# Patient Record
Sex: Male | Born: 1945 | Race: White | Hispanic: No | Marital: Married | Smoking: Former smoker
Health system: Southern US, Community
[De-identification: ages and names within clinical notes are randomized; demographics above are authoritative.]

## PROBLEM LIST (undated history)

## (undated) DIAGNOSIS — E785 Hyperlipidemia, unspecified: Secondary | ICD-10-CM

## (undated) DIAGNOSIS — S060X9A Concussion with loss of consciousness of unspecified duration, initial encounter: Secondary | ICD-10-CM

## (undated) DIAGNOSIS — D649 Anemia, unspecified: Secondary | ICD-10-CM

## (undated) DIAGNOSIS — N529 Male erectile dysfunction, unspecified: Secondary | ICD-10-CM

## (undated) DIAGNOSIS — N2581 Secondary hyperparathyroidism of renal origin: Secondary | ICD-10-CM

## (undated) DIAGNOSIS — Z992 Dependence on renal dialysis: Secondary | ICD-10-CM

## (undated) DIAGNOSIS — S060XAA Concussion with loss of consciousness status unknown, initial encounter: Secondary | ICD-10-CM

## (undated) DIAGNOSIS — R809 Proteinuria, unspecified: Secondary | ICD-10-CM

## (undated) DIAGNOSIS — E875 Hyperkalemia: Secondary | ICD-10-CM

## (undated) DIAGNOSIS — N2589 Other disorders resulting from impaired renal tubular function: Secondary | ICD-10-CM

## (undated) DIAGNOSIS — I10 Essential (primary) hypertension: Secondary | ICD-10-CM

## (undated) DIAGNOSIS — N184 Chronic kidney disease, stage 4 (severe): Secondary | ICD-10-CM

## (undated) HISTORY — DX: Chronic kidney disease, stage 4 (severe): N18.4

## (undated) HISTORY — DX: Hyperkalemia: E87.5

## (undated) HISTORY — DX: Secondary hyperparathyroidism of renal origin: N25.81

## (undated) HISTORY — DX: Anemia, unspecified: D64.9

## (undated) HISTORY — DX: Essential (primary) hypertension: I10

## (undated) HISTORY — DX: Male erectile dysfunction, unspecified: N52.9

## (undated) HISTORY — DX: Proteinuria, unspecified: R80.9

## (undated) HISTORY — DX: Hyperlipidemia, unspecified: E78.5

## (undated) HISTORY — DX: Other disorders resulting from impaired renal tubular function: N25.89

## (undated) HISTORY — PX: TONSILLECTOMY: SUR1361

---

## 2015-12-17 ENCOUNTER — Other Ambulatory Visit: Payer: Self-pay | Admitting: *Deleted

## 2015-12-17 DIAGNOSIS — N185 Chronic kidney disease, stage 5: Secondary | ICD-10-CM

## 2015-12-17 DIAGNOSIS — Z01810 Encounter for preprocedural cardiovascular examination: Secondary | ICD-10-CM

## 2015-12-18 ENCOUNTER — Encounter: Payer: Self-pay | Admitting: *Deleted

## 2016-01-06 ENCOUNTER — Encounter: Payer: Self-pay | Admitting: Vascular Surgery

## 2016-01-13 ENCOUNTER — Ambulatory Visit (INDEPENDENT_AMBULATORY_CARE_PROVIDER_SITE_OTHER)
Admission: RE | Admit: 2016-01-13 | Discharge: 2016-01-13 | Disposition: A | Discharge status: Home / Self Care | Payer: Medicare Other | Source: Ambulatory Visit | Attending: Vascular Surgery | Admitting: Vascular Surgery

## 2016-01-13 ENCOUNTER — Encounter: Payer: Self-pay | Admitting: Vascular Surgery

## 2016-01-13 ENCOUNTER — Ambulatory Visit (INDEPENDENT_AMBULATORY_CARE_PROVIDER_SITE_OTHER): Payer: Medicare Other | Admitting: Vascular Surgery

## 2016-01-13 ENCOUNTER — Ambulatory Visit (HOSPITAL_COMMUNITY)
Admission: RE | Admit: 2016-01-13 | Discharge: 2016-01-13 | Disposition: A | Discharge status: Home / Self Care | Payer: Medicare Other | Source: Ambulatory Visit | Attending: Vascular Surgery | Admitting: Vascular Surgery

## 2016-01-13 VITALS — BP 156/99 | HR 67 | Ht 66.0 in | Wt 162.3 lb

## 2016-01-13 DIAGNOSIS — N184 Chronic kidney disease, stage 4 (severe): Secondary | ICD-10-CM | POA: Diagnosis not present

## 2016-01-13 DIAGNOSIS — Z01810 Encounter for preprocedural cardiovascular examination: Secondary | ICD-10-CM

## 2016-01-13 DIAGNOSIS — E785 Hyperlipidemia, unspecified: Secondary | ICD-10-CM | POA: Insufficient documentation

## 2016-01-13 DIAGNOSIS — N185 Chronic kidney disease, stage 5: Secondary | ICD-10-CM | POA: Diagnosis not present

## 2016-01-13 DIAGNOSIS — N2581 Secondary hyperparathyroidism of renal origin: Secondary | ICD-10-CM | POA: Insufficient documentation

## 2016-01-13 DIAGNOSIS — K219 Gastro-esophageal reflux disease without esophagitis: Secondary | ICD-10-CM | POA: Insufficient documentation

## 2016-01-13 DIAGNOSIS — I129 Hypertensive chronic kidney disease with stage 1 through stage 4 chronic kidney disease, or unspecified chronic kidney disease: Secondary | ICD-10-CM | POA: Insufficient documentation

## 2016-01-13 NOTE — Progress Notes (Signed)
HISTORY AND PHYSICAL   History of Present Illness:  Patient is a 70 y.o. year old male who presents for placement of a permanent hemodialysis access. The patient is right handed.  He states he has about 15% kidney function at this time.  He is other wise very health with HTN managed with amlodipine, hypercholesterolemia managed with Lipitor.  He has no history of CAD or DM.  Past Medical History  Diagnosis Date  . Erectile dysfunction   . Esophageal reflux   . Chronic kidney disease, stage IV (severe) ([place])   . Hyperkalemia   . Secondary hyperparathyroidism of renal origin ([place])   . Proteinuria   . Hypertension   . Hyperlipidemia   . Renal tubular acidosis, type 1   . Anemia     Past Surgical History  Procedure Laterality Date  . Tonsillectomy       Social History Social History  Substance Use Topics  . Smoking status: Former Smoker    Quit date: 07/18/1989  . Smokeless tobacco: None  . Alcohol Use: 4.2 oz/week    7 Standard drinks or equivalent per week    Family History Family History  Problem Relation Age of Onset  . Hypertension Father   . Diabetes Father     Allergies  No Known Allergies   Current Outpatient Prescriptions  Medication Sig Dispense Refill  . aspirin 81 MG tablet Take 81 mg by mouth daily. Reported on 01/13/2016    . atorvastatin (LIPITOR) 20 MG tablet Take 20 mg by mouth daily. Reported on 01/13/2016    . calcitRIOL (ROCALTROL) 0.25 MCG capsule Take 0.25 mcg by mouth daily. Reported on 01/13/2016    . lisinopril (PRINIVIL,ZESTRIL) 10 MG tablet Take 10 mg by mouth daily. Reported on 01/13/2016    . sodium bicarbonate 650 MG tablet Take 650 mg by mouth 2 (two) times daily. Reported on 01/13/2016    . tadalafil (CIALIS) 20 MG tablet Take 10 mg by mouth daily as needed for erectile dysfunction. Reported on 01/13/2016     No current facility-administered medications for this visit.    ROS:   General:  No weight loss, Fever, chills  HEENT:  No recent headaches, no nasal bleeding, no visual changes, no sore throat  Neurologic: No dizziness, blackouts, seizures. No recent symptoms of stroke or mini- stroke. No recent episodes of slurred speech, or temporary blindness.  Cardiac: No recent episodes of chest pain/pressure, no shortness of breath at rest.  No shortness of breath with exertion.  Denies history of atrial fibrillation or irregular heartbeat  Vascular: No history of rest pain in feet.  No history of claudication.  No history of non-healing ulcer, No history of DVT   Pulmonary: No home oxygen, no productive cough, no hemoptysis,  No asthma or wheezing  Musculoskeletal:  [x ] Arthritis, [ ]  Low back pain,  [ ]  Joint pain  Hematologic:No history of hypercoagulable state.  No history of easy bleeding.  No history of anemia  Gastrointestinal: No hematochezia or melena,  No gastroesophageal reflux, no trouble swallowing  Urinary: [ x] chronic Kidney disease, [ ]  on HD - [ ]  MWF or [ ]  TTHS, [ ]  Burning with urination, [ ]  Frequent urination, [ ]  Difficulty urinating;   Skin: No rashes  Psychological: No history of anxiety,  No history of depression   Physical Examination  Filed Vitals:   01/13/16 1434 01/13/16 1437  BP: 160/100 156/99  Pulse: 67   Height: 5\' 6"  (  1.676 m)   Weight: 162 lb 4.8 oz (73.619 kg)   SpO2: 100%     Body mass index is 26.21 kg/(m^2).  General:  Alert and oriented, no acute distress HEENT: Normal Neck: No bruit or JVD Pulmonary: Clear to auscultation bilaterally Cardiac: Regular Rate and Rhythm without murmur Gastrointestinal: Soft, non-tender, non-distended, no mass, no scars Skin: No rash Extremity Pulses:  2+ radial, brachial pulses bilaterally Musculoskeletal: No deformity or edema  Neurologic: Upper and lower extremity motor 5/5 and symmetric  DATA:  Vein mapping shows marginal veins with the right upper arm cephalic being the best option 0.22-0.28 cm in  diameter.   ASSESSMENT:  CKD stage IV not yet requiring HD   PLAN: He was given information regarding both a fistula creation and av graft by Dr. Scot Dock today.  He states he is also interested in peritoneal HD which he wishes to discuss with Dr. Jimmy Footman as an option on his next visit.  At this time if we proceeded with av fistula creation the best option would be a a right cephalic av fistula due to small vein sizes else where.  He will f/u PRN if he wishes to proceed.    Theda Sers, Leandro Berkowitz MAUREEN PA-C [facility]  The patient was seen in conjunction with Dr. Scot Dock today  I have interviewed the patient and examined the patient. I agree with the findings by the PA.  It looks like his best option for a fistula would be a right brachiocephalic AV fistula. If this vein were not adequate he could potentially be a candidate for a basilic vein transposition either done in 1 stage or 2 stages. If he were not a candidate for either, given that his GFR is less than 15 I could place an AV graft if recommended by Dr. Jimmy Footman.    I have explained the indications for placement of an AV fistula or AV graft. I've explained that if at all possible we will place an AV fistula.  I have reviewed the risks of placement of an AV fistula including but not limited to: failure of the fistula to mature, need for subsequent interventions, and thrombosis. In addition I have reviewed the potential complications of placement of an AV graft. These risks include, but are not limited to, graft thrombosis, graft infection, wound healing problems, bleeding, arm swelling, and steal syndrome. All the patient's questions were answered.  He would like to discuss this further with Dr. Jimmy Footman, before considering surgery.  Gae Gallop, MD [phone]

## 2016-02-16 ENCOUNTER — Other Ambulatory Visit: Payer: Self-pay

## 2016-02-25 ENCOUNTER — Encounter: Payer: Self-pay | Admitting: Vascular Surgery

## 2016-03-01 ENCOUNTER — Ambulatory Visit (INDEPENDENT_AMBULATORY_CARE_PROVIDER_SITE_OTHER): Payer: Medicare Other | Admitting: Vascular Surgery

## 2016-03-01 ENCOUNTER — Encounter: Payer: Self-pay | Admitting: Vascular Surgery

## 2016-03-01 VITALS — BP 159/100 | HR 78 | Temp 98.1°F | Resp 18 | Ht 66.0 in | Wt 160.8 lb

## 2016-03-01 DIAGNOSIS — N184 Chronic kidney disease, stage 4 (severe): Secondary | ICD-10-CM | POA: Diagnosis not present

## 2016-03-01 NOTE — Progress Notes (Signed)
[facility]  Patient name: Rodney Bridges MRN: [id number] DOB: [birth year] Sex: male  REASON FOR VISIT: permanent dialysis access  HPI: Rodney Bridges is a 70 y.o. male, who who presents for evaluation for permanent dialysis access. He was last seen in the office on 01/13/2016 regarding access. At that time, he was not ready to proceed with surgery and wondered to discuss things with Dr. Jimmy Footman. He was also interested in peritoneal dialysis at that time. Today, he says that he is ready to pursue permanent dialysis access. He is projected to be starting dialysis in the next 6-12 months. The patient is right-handed. He reports having proteinuria since he was young and this may have contributed to his kidney disease.   He has no prior history of CAD, CVA or CHF. He is not diabetic.  His hypertension is managed on amlodipine. His hypercholesterolemia is managed on a statin. He reports some left toe pain that has been treated with prednisone. He states that this does not help. He denies any claudication or rest pain issues. He has been complaining of bilateral leg swelling. He denies any significant pain associated with this. He is a former smoker.  Past Medical History:  Diagnosis Date  . Anemia   . Chronic kidney disease, stage IV (severe) ([place])   . Erectile dysfunction   . Esophageal reflux   . Hyperkalemia   . Hyperlipidemia   . Hypertension   . Proteinuria   . Renal tubular acidosis, type 1   . Secondary hyperparathyroidism of renal origin [facility])     Family History  Problem Relation Age of Onset  . Hypertension Father   . Diabetes Father     SOCIAL HISTORY: Social History   Social History  . Marital status: Married    Spouse name: N/A  . Number of children: N/A  . Years of education: N/A   Occupational History  . Not on file.   Social History Main Topics  . Smoking status: Former Smoker    Quit date: 07/18/1989  . Smokeless tobacco:  Never Used  . Alcohol use 4.2 oz/week    7 Standard drinks or equivalent per week  . Drug use: No  . Sexual activity: Not on file   Other Topics Concern  . Not on file   Social History Narrative  . No narrative on file    Allergies  Allergen Reactions  . No Known Allergies   . Prednisone     Difficulty swallowing/reported by patient    Current Outpatient Prescriptions  Medication Sig Dispense Refill  . amLODipine (NORVASC) 10 MG tablet Take 1 tablet by mouth daily.  1  . atorvastatin (LIPITOR) 20 MG tablet Take 20 mg by mouth daily. Reported on 01/13/2016    . calcitRIOL (ROCALTROL) 0.25 MCG capsule Take 0.5 mcg by mouth daily. Reported on 01/13/2016    . furosemide (LASIX) 40 MG tablet Take 80 mg by mouth 2 (two) times daily.  0  . sodium bicarbonate 650 MG tablet Take 1,950 mg by mouth 2 (two) times daily. Reported on 01/13/2016    . tadalafil (CIALIS) 20 MG tablet Take 10 mg by mouth daily as needed for erectile dysfunction. Reported on 01/13/2016    . aspirin 81 MG tablet Take 81 mg by mouth daily. Reported on 01/13/2016     No current facility-administered medications for this visit.     REVIEW OF SYSTEMS:  [X]  denotes positive finding, [ ]  denotes negative finding Cardiac  Comments:  Chest pain or chest pressure:    Shortness of breath upon exertion:    Short of breath when lying flat:    Irregular heart rhythm:        Vascular    Pain in calf, thigh, or hip brought on by ambulation:    Pain in feet at night that wakes you up from your sleep:  x Pain in toe.   Blood clot in your veins:    Leg swelling:  x       Pulmonary    Oxygen at home:    Productive cough:     Wheezing:         Neurologic    Sudden weakness in arms or legs:     Sudden numbness in arms or legs:     Sudden onset of difficulty speaking or slurred speech:    Temporary loss of vision in one eye:     Problems with dizziness:         Gastrointestinal    Blood in stool:     Vomited blood:           Genitourinary    Burning when urinating:     Blood in urine:        Psychiatric    Major depression:         Hematologic    Bleeding problems:    Problems with blood clotting too easily:        Skin    Rashes or ulcers:        Constitutional    Fever or chills:      PHYSICAL EXAM: Vitals:   03/01/16 1541 03/01/16 1544  BP: (!) 158/102 (!) 159/100  Pulse: 78   Resp: 18   Temp: 98.1 F (36.7 C)   TempSrc: Oral   SpO2: 97%   Weight: 160 lb 12.8 oz (72.9 kg)   Height: 5\' 6"  (1.676 m)     GENERAL: The patient is a well-nourished male, in no acute distress. The vital signs are documented above. CARDIAC: There is a regular rate and rhythm. No carotid bruits.  VASCULAR: 2+ radial pulses and brachial pulses bilaterally. 2+ left dorsalis pedis pulse. Prominent left cephalic vein.  PULMONARY: There is good air exchange bilaterally without wheezing or rales.  MUSCULOSKELETAL: There are no major deformities or cyanosis. NEUROLOGIC: No focal weakness or paresthesias are detected. SKIN:Left great toe without any erythema or open wounds. Ischemic changes.  PSYCHIATRIC: The patient has a normal affect.  DATA:  Upper extremity vein mapping 01/13/2016  Upper extremity vein mapping shows marginal right cephalic and basilic veins. Left cephalic is small. Left basilic is marginal.  MEDICAL ISSUES: CKD stage IV  The patient is not yet on dialysis. He is right-handed. His vein mapping shows marginal size veins. However on physical exam and with SonoSite, it appears that he has a adequate left cephalic vein at the wrist. Plan for left radial cephalic fistula tomorrow with Dr. Bridgett Larsson. The procedure, risks and benefits were discussed at length with the patient. He is willing to proceed.   Virgina Jock, PA-C [facility] [place] [phone]   Reimaged his cephalic vein with SonoSite. Dystocia nice caliber vein. Discussed this with patient explaining that  they're occasionally discrepancies between a formal duplex and below what we see with SonoSite. Perhaps he was dry at the time of the duplex. I certainly would recommend proceeding with left radiocephalic fistula his initial fistula attempt. He understands.  I have examined the patient, reviewed and agree with above.Curt Jews, MD 03/01/2016 4:53 PM

## 2016-03-01 NOTE — Progress Notes (Signed)
Vitals:   03/01/16 1541 03/01/16 1544  BP: (!) 158/102 (!) 159/100  Pulse: 78   Resp: 18   Temp: 98.1 F (36.7 C)   TempSrc: Oral   SpO2: 97%   Weight: 160 lb 12.8 oz (72.9 kg)   Height: 5\' 6"  (1.676 m)

## 2016-03-08 ENCOUNTER — Encounter (HOSPITAL_COMMUNITY): Payer: Self-pay | Admitting: *Deleted

## 2016-03-08 NOTE — Anesthesia Preprocedure Evaluation (Addendum)
Anesthesia Evaluation  Patient identified by MRN, date of birth, ID band Patient awake    Reviewed: Allergy & Precautions, NPO status , Patient's Chart, lab work & pertinent test results  History of Anesthesia Complications Negative for: history of anesthetic complications  Airway Mallampati: II  TM Distance: >3 FB Neck ROM: Full    Dental  (+) Dental Advisory Given, Chipped   Pulmonary former smoker (quit 1991),    breath sounds clear to auscultation       Cardiovascular hypertension, Pt. on medications (-) angina Rhythm:Regular Rate:Normal     Neuro/Psych negative neurological ROS     GI/Hepatic negative GI ROS,   Endo/Other  negative endocrine ROS  Renal/GU Renal disease (K+ 3.7)     Musculoskeletal   Abdominal   Peds  Hematology  (+) Blood dyscrasia (Hb 10.9), ,   Anesthesia Other Findings   Reproductive/Obstetrics                           Anesthesia Physical Anesthesia Plan  ASA: III  Anesthesia Plan: MAC   Post-op Pain Management:    Induction: Intravenous  Airway Management Planned: Natural Airway and Simple Face Mask  Additional Equipment:   Intra-op Plan:   Post-operative Plan:   Informed Consent: I have reviewed the patients History and Physical, chart, labs and discussed the procedure including the risks, benefits and alternatives for the proposed anesthesia with the patient or authorized representative who has indicated his/her understanding and acceptance.   Dental advisory given  Plan Discussed with: Surgeon and CRNA  Anesthesia Plan Comments: (Plan routine monitors, MAC)        Anesthesia Quick Evaluation

## 2016-03-09 ENCOUNTER — Ambulatory Visit (HOSPITAL_COMMUNITY)
Admission: RE | Admit: 2016-03-09 | Discharge: 2016-03-09 | Disposition: A | Discharge status: Home / Self Care | Payer: Medicare Other | Source: Ambulatory Visit | Attending: Vascular Surgery | Admitting: Vascular Surgery

## 2016-03-09 ENCOUNTER — Ambulatory Visit (HOSPITAL_COMMUNITY): Payer: Medicare Other | Admitting: Anesthesiology

## 2016-03-09 ENCOUNTER — Encounter (HOSPITAL_COMMUNITY)
Admission: RE | Disposition: A | Discharge status: Home / Self Care | Payer: Self-pay | Source: Ambulatory Visit | Attending: Vascular Surgery

## 2016-03-09 ENCOUNTER — Encounter (HOSPITAL_COMMUNITY): Payer: Self-pay | Admitting: Anesthesiology

## 2016-03-09 DIAGNOSIS — K219 Gastro-esophageal reflux disease without esophagitis: Secondary | ICD-10-CM | POA: Insufficient documentation

## 2016-03-09 DIAGNOSIS — I129 Hypertensive chronic kidney disease with stage 1 through stage 4 chronic kidney disease, or unspecified chronic kidney disease: Secondary | ICD-10-CM | POA: Insufficient documentation

## 2016-03-09 DIAGNOSIS — E785 Hyperlipidemia, unspecified: Secondary | ICD-10-CM | POA: Diagnosis not present

## 2016-03-09 DIAGNOSIS — N529 Male erectile dysfunction, unspecified: Secondary | ICD-10-CM | POA: Insufficient documentation

## 2016-03-09 DIAGNOSIS — N185 Chronic kidney disease, stage 5: Secondary | ICD-10-CM | POA: Diagnosis not present

## 2016-03-09 DIAGNOSIS — Z79899 Other long term (current) drug therapy: Secondary | ICD-10-CM | POA: Diagnosis not present

## 2016-03-09 DIAGNOSIS — N184 Chronic kidney disease, stage 4 (severe): Secondary | ICD-10-CM | POA: Insufficient documentation

## 2016-03-09 DIAGNOSIS — Z87891 Personal history of nicotine dependence: Secondary | ICD-10-CM | POA: Diagnosis not present

## 2016-03-09 DIAGNOSIS — Z7982 Long term (current) use of aspirin: Secondary | ICD-10-CM | POA: Insufficient documentation

## 2016-03-09 HISTORY — DX: Concussion with loss of consciousness status unknown, initial encounter: S06.0XAA

## 2016-03-09 HISTORY — PX: AV FISTULA PLACEMENT: SHX1204

## 2016-03-09 HISTORY — DX: Concussion with loss of consciousness of unspecified duration, initial encounter: S06.0X9A

## 2016-03-09 LAB — POCT I-STAT 4, (NA,K, GLUC, HGB,HCT)
Glucose, Bld: 116 mg/dL — ABNORMAL HIGH (ref 65–99)
HEMATOCRIT: 32 % — AB (ref 39.0–52.0)
Hemoglobin: 10.9 g/dL — ABNORMAL LOW (ref 13.0–17.0)
Potassium: 3.7 mmol/L (ref 3.5–5.1)
Sodium: 143 mmol/L (ref 135–145)

## 2016-03-09 SURGERY — ARTERIOVENOUS (AV) FISTULA CREATION
Anesthesia: Monitor Anesthesia Care | Site: Arm Upper | Laterality: Left

## 2016-03-09 MED ORDER — SODIUM CHLORIDE 0.9 % IV SOLN
INTRAVENOUS | Status: DC | PRN
  Administered 2016-03-09 (×2): via INTRAVENOUS

## 2016-03-09 MED ORDER — OXYCODONE-ACETAMINOPHEN 5-325 MG PO TABS: 1.0000 | ORAL_TABLET | Freq: Four times a day (QID) | ORAL | 0 refills | Status: DC | PRN

## 2016-03-09 MED ORDER — SODIUM CHLORIDE 0.9 % IV SOLN: INTRAVENOUS | Status: DC

## 2016-03-09 MED ORDER — CHLORHEXIDINE GLUCONATE CLOTH 2 % EX PADS: 6.0000 | MEDICATED_PAD | Freq: Once | CUTANEOUS | Status: DC

## 2016-03-09 MED ORDER — PROPOFOL 500 MG/50ML IV EMUL
INTRAVENOUS | Status: DC | PRN
  Administered 2016-03-09: 50 ug/kg/min via INTRAVENOUS

## 2016-03-09 MED ORDER — 0.9 % SODIUM CHLORIDE (POUR BTL) OPTIME
TOPICAL | Status: DC | PRN
  Administered 2016-03-09: 1000 mL

## 2016-03-09 MED ORDER — PROPOFOL 10 MG/ML IV BOLUS
INTRAVENOUS | Status: DC | PRN
  Administered 2016-03-09: 20 mg via INTRAVENOUS
  Administered 2016-03-09: 10 mg via INTRAVENOUS

## 2016-03-09 MED ORDER — SODIUM CHLORIDE 0.9 % IV SOLN
INTRAVENOUS | Status: DC | PRN
  Administered 2016-03-09: 09:00:00

## 2016-03-09 MED ORDER — FENTANYL CITRATE (PF) 100 MCG/2ML IJ SOLN: 25.0000 ug | INTRAMUSCULAR | Status: DC | PRN

## 2016-03-09 MED ORDER — MIDAZOLAM HCL 2 MG/2ML IJ SOLN: 0.5000 mg | Freq: Once | INTRAMUSCULAR | Status: DC | PRN

## 2016-03-09 MED ORDER — DEXTROSE 5 % IV SOLN
1.5000 g | INTRAVENOUS | Status: AC
  Administered 2016-03-09: 1.5 g via INTRAVENOUS

## 2016-03-09 MED ORDER — FENTANYL CITRATE (PF) 100 MCG/2ML IJ SOLN
INTRAMUSCULAR | Status: DC | PRN
  Administered 2016-03-09: 50 ug via INTRAVENOUS

## 2016-03-09 MED ORDER — LIDOCAINE-EPINEPHRINE 0.5 %-1:200000 IJ SOLN
INTRAMUSCULAR | Status: AC
  Filled 2016-03-09: qty 1

## 2016-03-09 MED ORDER — MEPERIDINE HCL 25 MG/ML IJ SOLN: 6.2500 mg | INTRAMUSCULAR | Status: DC | PRN

## 2016-03-09 MED ORDER — MIDAZOLAM HCL 5 MG/5ML IJ SOLN
INTRAMUSCULAR | Status: DC | PRN
  Administered 2016-03-09: 2 mg via INTRAVENOUS

## 2016-03-09 MED ORDER — PROMETHAZINE HCL 25 MG/ML IJ SOLN: 6.2500 mg | INTRAMUSCULAR | Status: DC | PRN

## 2016-03-09 MED ORDER — LIDOCAINE-EPINEPHRINE 0.5 %-1:200000 IJ SOLN
INTRAMUSCULAR | Status: DC | PRN
  Administered 2016-03-09: 50 mL

## 2016-03-09 SURGICAL SUPPLY — 40 items
ARMBAND PINK RESTRICT EXTREMIT (MISCELLANEOUS) ×3 IMPLANT
BENZOIN TINCTURE PRP APPL 2/3 (GAUZE/BANDAGES/DRESSINGS) ×3 IMPLANT
CANISTER SUCTION 2500CC (MISCELLANEOUS) ×3 IMPLANT
CANNULA VESSEL 3MM 2 BLNT TIP (CANNULA) ×3 IMPLANT
CLIP LIGATING EXTRA MED SLVR (CLIP) ×6 IMPLANT
CLIP LIGATING EXTRA SM BLUE (MISCELLANEOUS) ×3 IMPLANT
CLOSURE WOUND 1/2 X4 (GAUZE/BANDAGES/DRESSINGS) ×1
COVER PROBE W GEL 5X96 (DRAPES) IMPLANT
DECANTER SPIKE VIAL GLASS SM (MISCELLANEOUS) ×3 IMPLANT
ELECT REM PT RETURN 9FT ADLT (ELECTROSURGICAL) ×3
ELECTRODE REM PT RTRN 9FT ADLT (ELECTROSURGICAL) ×1 IMPLANT
GAUZE SPONGE 2X2 8PLY STRL LF (GAUZE/BANDAGES/DRESSINGS) ×1 IMPLANT
GAUZE SPONGE 4X4 12PLY STRL (GAUZE/BANDAGES/DRESSINGS) IMPLANT
GEL ULTRASOUND 20GR AQUASONIC (MISCELLANEOUS) IMPLANT
GLOVE BIO SURGEON STRL SZ 6.5 (GLOVE) ×2 IMPLANT
GLOVE BIO SURGEONS STRL SZ 6.5 (GLOVE) ×1
GLOVE BIOGEL PI IND STRL 6.5 (GLOVE) ×1 IMPLANT
GLOVE BIOGEL PI IND STRL 7.0 (GLOVE) ×1 IMPLANT
GLOVE BIOGEL PI IND STRL 8 (GLOVE) ×1 IMPLANT
GLOVE BIOGEL PI INDICATOR 6.5 (GLOVE) ×2
GLOVE BIOGEL PI INDICATOR 7.0 (GLOVE) ×2
GLOVE BIOGEL PI INDICATOR 8 (GLOVE) ×2
GLOVE ECLIPSE 7.5 STRL STRAW (GLOVE) ×3 IMPLANT
GLOVE SS BIOGEL STRL SZ 7.5 (GLOVE) ×1 IMPLANT
GLOVE SUPERSENSE BIOGEL SZ 7.5 (GLOVE) ×2
GOWN STRL REUS W/ TWL LRG LVL3 (GOWN DISPOSABLE) ×3 IMPLANT
GOWN STRL REUS W/TWL LRG LVL3 (GOWN DISPOSABLE) ×6
KIT BASIN OR (CUSTOM PROCEDURE TRAY) ×3 IMPLANT
KIT ROOM TURNOVER OR (KITS) ×3 IMPLANT
NS IRRIG 1000ML POUR BTL (IV SOLUTION) ×3 IMPLANT
PACK CV ACCESS (CUSTOM PROCEDURE TRAY) ×3 IMPLANT
PAD ARMBOARD 7.5X6 YLW CONV (MISCELLANEOUS) ×6 IMPLANT
SPONGE GAUZE 2X2 STER 10/PKG (GAUZE/BANDAGES/DRESSINGS) ×2
STRIP CLOSURE SKIN 1/2X4 (GAUZE/BANDAGES/DRESSINGS) ×2 IMPLANT
SUT PROLENE 6 0 CC (SUTURE) ×3 IMPLANT
SUT VIC AB 3-0 SH 27 (SUTURE) ×2
SUT VIC AB 3-0 SH 27X BRD (SUTURE) ×1 IMPLANT
TAPE CLOTH SURG 4X10 WHT LF (GAUZE/BANDAGES/DRESSINGS) ×3 IMPLANT
UNDERPAD 30X30 INCONTINENT (UNDERPADS AND DIAPERS) ×3 IMPLANT
WATER STERILE IRR 1000ML POUR (IV SOLUTION) ×3 IMPLANT

## 2016-03-09 NOTE — H&P (View-Only) (Signed)
[facility]  Patient name: Rodney Bridges MRN: [id number] DOB: Aug 14, 1945 Sex: male  REASON FOR VISIT: permanent dialysis access  HPI: Rodney Bridges is a 70 y.o. male, who who presents for evaluation for permanent dialysis access. He was last seen in the office on 01/13/2016 regarding access. At that time, he was not ready to proceed with surgery and wondered to discuss things with Dr. Jimmy Footman. He was also interested in peritoneal dialysis at that time. Today, he says that he is ready to pursue permanent dialysis access. He is projected to be starting dialysis in the next 6-12 months. The patient is right-handed. He reports having proteinuria since he was young and this may have contributed to his kidney disease.   He has no prior history of CAD, CVA or CHF. He is not diabetic.  His hypertension is managed on amlodipine. His hypercholesterolemia is managed on a statin. He reports some left toe pain that has been treated with prednisone. He states that this does not help. He denies any claudication or rest pain issues. He has been complaining of bilateral leg swelling. He denies any significant pain associated with this. He is a former smoker.  Past Medical History:  Diagnosis Date  . Anemia   . Chronic kidney disease, stage IV (severe) ([place])   . Erectile dysfunction   . Esophageal reflux   . Hyperkalemia   . Hyperlipidemia   . Hypertension   . Proteinuria   . Renal tubular acidosis, type 1   . Secondary hyperparathyroidism of renal origin [facility])     Family History  Problem Relation Age of Onset  . Hypertension Father   . Diabetes Father     SOCIAL HISTORY: Social History   Social History  . Marital status: Married    Spouse name: N/A  . Number of children: N/A  . Years of education: N/A   Occupational History  . Not on file.   Social History Main Topics  . Smoking status: Former Smoker    Quit date: 07/18/1989  . Smokeless tobacco:  Never Used  . Alcohol use 4.2 oz/week    7 Standard drinks or equivalent per week  . Drug use: No  . Sexual activity: Not on file   Other Topics Concern  . Not on file   Social History Narrative  . No narrative on file    Allergies  Allergen Reactions  . No Known Allergies   . Prednisone     Difficulty swallowing/reported by patient    Current Outpatient Prescriptions  Medication Sig Dispense Refill  . amLODipine (NORVASC) 10 MG tablet Take 1 tablet by mouth daily.  1  . atorvastatin (LIPITOR) 20 MG tablet Take 20 mg by mouth daily. Reported on 01/13/2016    . calcitRIOL (ROCALTROL) 0.25 MCG capsule Take 0.5 mcg by mouth daily. Reported on 01/13/2016    . furosemide (LASIX) 40 MG tablet Take 80 mg by mouth 2 (two) times daily.  0  . sodium bicarbonate 650 MG tablet Take 1,950 mg by mouth 2 (two) times daily. Reported on 01/13/2016    . tadalafil (CIALIS) 20 MG tablet Take 10 mg by mouth daily as needed for erectile dysfunction. Reported on 01/13/2016    . aspirin 81 MG tablet Take 81 mg by mouth daily. Reported on 01/13/2016     No current facility-administered medications for this visit.     REVIEW OF SYSTEMS:  [X]  denotes positive finding, [ ]  denotes negative finding Cardiac  Comments:  Chest pain or chest pressure:    Shortness of breath upon exertion:    Short of breath when lying flat:    Irregular heart rhythm:        Vascular    Pain in calf, thigh, or hip brought on by ambulation:    Pain in feet at night that wakes you up from your sleep:  x Pain in toe.   Blood clot in your veins:    Leg swelling:  x       Pulmonary    Oxygen at home:    Productive cough:     Wheezing:         Neurologic    Sudden weakness in arms or legs:     Sudden numbness in arms or legs:     Sudden onset of difficulty speaking or slurred speech:    Temporary loss of vision in one eye:     Problems with dizziness:         Gastrointestinal    Blood in stool:     Vomited blood:           Genitourinary    Burning when urinating:     Blood in urine:        Psychiatric    Major depression:         Hematologic    Bleeding problems:    Problems with blood clotting too easily:        Skin    Rashes or ulcers:        Constitutional    Fever or chills:      PHYSICAL EXAM: Vitals:   03/01/16 1541 03/01/16 1544  BP: (!) 158/102 (!) 159/100  Pulse: 78   Resp: 18   Temp: 98.1 F (36.7 C)   TempSrc: Oral   SpO2: 97%   Weight: 160 lb 12.8 oz (72.9 kg)   Height: 5\' 6"  (1.676 m)     GENERAL: The patient is a well-nourished male, in no acute distress. The vital signs are documented above. CARDIAC: There is a regular rate and rhythm. No carotid bruits.  VASCULAR: 2+ radial pulses and brachial pulses bilaterally. 2+ left dorsalis pedis pulse. Prominent left cephalic vein.  PULMONARY: There is good air exchange bilaterally without wheezing or rales.  MUSCULOSKELETAL: There are no major deformities or cyanosis. NEUROLOGIC: No focal weakness or paresthesias are detected. SKIN:Left great toe without any erythema or open wounds. Ischemic changes.  PSYCHIATRIC: The patient has a normal affect.  DATA:  Upper extremity vein mapping 01/13/2016  Upper extremity vein mapping shows marginal right cephalic and basilic veins. Left cephalic is small. Left basilic is marginal.  MEDICAL ISSUES: CKD stage IV  The patient is not yet on dialysis. He is right-handed. His vein mapping shows marginal size veins. However on physical exam and with SonoSite, it appears that he has a adequate left cephalic vein at the wrist. Plan for left radial cephalic fistula tomorrow with Dr. Bridgett Larsson. The procedure, risks and benefits were discussed at length with the patient. He is willing to proceed.   Virgina Jock, PA-C [facility] [place] [phone]   Reimaged his cephalic vein with SonoSite. Dystocia nice caliber vein. Discussed this with patient explaining that  they're occasionally discrepancies between a formal duplex and below what we see with SonoSite. Perhaps he was dry at the time of the duplex. I certainly would recommend proceeding with left radiocephalic fistula his initial fistula attempt. He understands.  I have examined the patient, reviewed and agree with above.Curt Jews, MD 03/01/2016 4:53 PM

## 2016-03-09 NOTE — Anesthesia Procedure Notes (Signed)
Procedure Name: MAC Date/Time: 03/09/2016 8:48 AM Performed by: Neldon Newport Pre-anesthesia Checklist: Patient being monitored, Timeout performed, Suction available, Emergency Drugs available and Patient identified Patient Re-evaluated:Patient Re-evaluated prior to inductionOxygen Delivery Method: Nasal cannula

## 2016-03-09 NOTE — Interval H&P Note (Signed)
History and Physical Interval Note:  03/09/2016 8:12 AM  Rodney Bridges  has presented today for surgery, with the diagnosis of Stage IV Chronic Kidney Disease N18.4  The various methods of treatment have been discussed with the patient and family. After consideration of risks, benefits and other options for treatment, the patient has consented to  Procedure(s): RADIOCEPHALIC ARTERIOVENOUS (AV) FISTULA CREATION (Left) as a surgical intervention .  The patient's history has been reviewed, patient examined, no change in status, stable for surgery.  I have reviewed the patient's chart and labs.  Questions were answered to the patient's satisfaction.     Curt Jews

## 2016-03-09 NOTE — Op Note (Signed)
    OPERATIVE REPORT  DATE OF SURGERY: 03/09/2016  PATIENT: Rodney Bridges, 70 y.o. male MRN: [id number]  DOB: July 28, 1945  PRE-OPERATIVE DIAGNOSIS: Chronic renal insufficiency  POST-OPERATIVE DIAGNOSIS:  Same  PROCEDURE: Left radiocephalic AV fistula creation  SURGEON:  Curt Jews, M.D.  PHYSICIAN ASSISTANT: Silva Bandy PA-C  ANESTHESIA:  Local with sedation  EBL: Minimal ml  Total I/O In: 500 [I.V.:500] Out: 3 [Blood:3]  BLOOD ADMINISTERED: None  DRAINS: None  SPECIMEN: None  COUNTS CORRECT:  YES  PLAN OF CARE: PACU stable   PATIENT DISPOSITION:  PACU - hemodynamically stable  PROCEDURE DETAILS: Patient was taken to the operative placed supine position where the area of the left arm and left wrist and hand were prepped and draped in usual sterile fashion. Incision was made using local anesthesia between the level of the radial artery and the cephalic vein at the wrist. Tributary branches of the cephalic vein were ligated and divided. Good caliber. The vein was ligated distally and divided and was mobilized to the level of the radial artery. The radial artery was exposed the same incision and was also of good caliber. The artery was occluded proximally and distally and was opened with an 11 blade and extended longitudinally with Potts scissors. The vein was cut to appropriate length and was spatulated and sewn end-to-side to the artery with a running 6-0 Prolene suture. Clamps removed and excellent flow was noted through the vein. The vein dilated up to approximately 5-6 mm. The incision was closed with 3-0 Vicryl in the subcutaneous and subcuticular tissue. Benzoin and Steri-Strips were applied. The patient was transferred to the recovery room in stable condition   Curt Jews, M.D. 03/09/2016 11:15 AM

## 2016-03-09 NOTE — Transfer of Care (Signed)
Immediate Anesthesia Transfer of Care Note  Patient: Rodney Bridges  Procedure(s) Performed: Procedure(s): RADIOCEPHALIC ARTERIOVENOUS (AV) FISTULA CREATION - LEFT (Left)  Patient Location: PACU  Anesthesia Type:MAC  Level of Consciousness: awake, alert  and oriented  Airway & Oxygen Therapy: Patient Spontanous Breathing and Patient connected to nasal cannula oxygen  Post-op Assessment: Report given to RN, Post -op Vital signs reviewed and stable and Patient moving all extremities X 4  Post vital signs: Reviewed and stable  Last Vitals:  Vitals:   03/09/16 0653  BP: (!) 146/93  Pulse: 68  Resp: 18    Last Pain: There were no vitals filed for this visit.    Patients Stated Pain Goal: 3 ([id number])  Complications: No apparent anesthesia complications

## 2016-03-09 NOTE — Anesthesia Postprocedure Evaluation (Signed)
Anesthesia Post Note  Patient: Rigoberto Nandin  Procedure(s) Performed: Procedure(s) (LRB): RADIOCEPHALIC ARTERIOVENOUS (AV) FISTULA CREATION - LEFT (Left)  Patient location during evaluation: PACU Anesthesia Type: MAC Level of consciousness: awake and alert, patient cooperative and oriented Pain management: pain level controlled Vital Signs Assessment: post-procedure vital signs reviewed and stable Respiratory status: spontaneous breathing, nonlabored ventilation and respiratory function stable Cardiovascular status: stable and blood pressure returned to baseline Postop Assessment: no signs of nausea or vomiting Anesthetic complications: no    Last Vitals:  Vitals:   03/09/16 1015 03/09/16 1022  BP:  (!) 175/78  Pulse: (!) 59 (!) 59  Resp: 15 16  Temp:      Last Pain:  Vitals:   03/09/16 1022  PainSc: 0-No pain                 Isabellamarie Randa,E. Ilija Maxim

## 2016-03-10 ENCOUNTER — Encounter (HOSPITAL_COMMUNITY): Payer: Self-pay | Admitting: Vascular Surgery

## 2016-03-10 ENCOUNTER — Telehealth: Payer: Self-pay

## 2016-03-10 NOTE — Telephone Encounter (Signed)
Rec'd phone call from pt.; reported difficulty clearing throat of phlegm.  Stated he has been able to swallow coffee and solid food, but still feels like he has trouble clearing throat.  Stated, during night, his throat felt like it was "very dry and somewhat sore."  Also c/o dizziness.  Gave suggestions of warm salt water gargle, use of soothing throat lozenges (herbal or honey lemon), and increase in oral fluid intake.  Reported he has been taking Lasix 80 mg bid, and recently changed his dose to 40 mg bid, due to the increased dryness of his mouth.  Advised that coffee may also dry-out system, due to the diuretic effect it has.  Also advised pt. that he may be experiencing post-anesthesia effects from 8/23.  Encouraged to try the above recommendations, and to call if symptoms don't improve.  Verb. Understanding.

## 2016-03-11 ENCOUNTER — Telehealth: Payer: Self-pay | Admitting: Vascular Surgery

## 2016-03-11 NOTE — Telephone Encounter (Signed)
-----   Message from Mena Goes, RN sent at 03/09/2016 10:10 AM EDT ----- Regarding: schedule 4 weeks   ----- Message ----- From: Alvia Grove, PA-C Sent: 03/09/2016   9:40 AM To: [facility] Charge Pool  S/p left radial-cephalic AVF [id number]  F/u with Dr. Donnetta Hutching in 4 weeks. No duplex.  Thanks Maudie Mercury

## 2016-03-11 NOTE — Telephone Encounter (Signed)
Sched appt 9/19 at 8:45. Lm on hm# to inform pt.

## 2016-03-17 ENCOUNTER — Telehealth: Payer: Self-pay

## 2016-03-17 NOTE — Telephone Encounter (Signed)
Pt. called to ask when Dr. Donnetta Hutching would approve him to resume exercise routine with weights.  Stated he lifts 15 # doing arm curls, and 75# chest presses.  Per Dr. Donnetta Hutching, the pt. should wait 2 weeks from date of surgery to resume his weight lifting exercise routine.  Notified pt. Of above recommendations.  Verb. understanding.

## 2016-03-31 ENCOUNTER — Encounter: Payer: Self-pay | Admitting: Vascular Surgery

## 2016-04-05 ENCOUNTER — Encounter: Payer: Self-pay | Admitting: Vascular Surgery

## 2016-04-05 ENCOUNTER — Ambulatory Visit (INDEPENDENT_AMBULATORY_CARE_PROVIDER_SITE_OTHER): Payer: Medicare Other | Admitting: Vascular Surgery

## 2016-04-05 VITALS — BP 134/83 | HR 71 | Temp 98.0°F | Ht 66.0 in | Wt 159.5 lb

## 2016-04-05 DIAGNOSIS — N184 Chronic kidney disease, stage 4 (severe): Secondary | ICD-10-CM

## 2016-04-05 NOTE — Progress Notes (Signed)
   Patient name: Rodney Bridges MRN: [id number] DOB: 09-Jan-1946 Sex: male  REASON FOR VISIT: Left left radiocephalic AV fistula from 20/94/7096  HPI: Rodney Bridges is a 70 y.o. male here today for follow-up. Had uneventful recovery after his left radiocephalic fistula. He reports the typical amount of tingling below the level of his thumb onto the dorsum of his hand following the radiocephalic incision. Return to his usual activities.  Current Outpatient Prescriptions  Medication Sig Dispense Refill  . acetaminophen (TYLENOL) 500 MG tablet Take 1,000 mg by mouth every 6 (six) hours as needed.    Marland Kitchen amLODipine (NORVASC) 10 MG tablet Take 1 tablet by mouth daily.  1  . aspirin 81 MG tablet Take 81 mg by mouth daily. Reported on 01/13/2016    . atorvastatin (LIPITOR) 20 MG tablet Take 20 mg by mouth daily. Reported on 01/13/2016    . calcitRIOL (ROCALTROL) 0.25 MCG capsule Take 0.5 mcg by mouth daily. Reported on 01/13/2016    . furosemide (LASIX) 40 MG tablet Take 80 mg by mouth 2 (two) times daily.  0  . oxyCODONE-acetaminophen (ROXICET) 5-325 MG tablet Take 1 tablet by mouth every 6 (six) hours as needed. 6 tablet 0  . sodium bicarbonate 650 MG tablet Take 1,950 mg by mouth 2 (two) times daily. Reported on 01/13/2016    . tadalafil (CIALIS) 20 MG tablet Take 10 mg by mouth daily as needed for erectile dysfunction. Reported on 01/13/2016     No current facility-administered medications for this visit.      PHYSICAL EXAM: Vitals:   04/05/16 0834  BP: 134/83  Pulse: 71  Temp: 98 F (36.7 C)  TempSrc: Oral  SpO2: 98%  Weight: 159 lb 8 oz (72.3 kg)  Height: 5\' 6"  (1.676 m)    GENERAL: The patient is a well-nourished male, in no acute distress. The vital signs are documented above. Left radiocephalic incision healing nicely. Has an excellent thrill throughout his fistula. Very nice Journiee Feldkamp maturation of the cephalic vein in his upper arm extending into  the upper arm cephalic vein as well  MEDICAL ISSUES: Good Kelii Chittum result at one month from radiocephalic fistula. Continue full activities with no limitation. Reports that his renal function is remaining stable currently. Will see [place] again on an as-needed basis area and I feel that he has a very high likelihood that this will provide hemodialysis access if he progresses to renal failure   Rosetta Posner, MD FACS [facility] Tel [phone] Pager [phone]

## 2016-05-24 ENCOUNTER — Other Ambulatory Visit: Payer: Self-pay | Admitting: Nephrology

## 2016-05-24 DIAGNOSIS — Z7682 Awaiting organ transplant status: Secondary | ICD-10-CM

## 2016-06-01 ENCOUNTER — Ambulatory Visit
Admission: RE | Admit: 2016-06-01 | Discharge: 2016-06-01 | Disposition: A | Discharge status: Home / Self Care | Payer: Medicare Other | Source: Ambulatory Visit | Attending: Nephrology | Admitting: Nephrology

## 2016-06-01 DIAGNOSIS — Z7682 Awaiting organ transplant status: Secondary | ICD-10-CM

## 2016-06-24 ENCOUNTER — Telehealth (HOSPITAL_COMMUNITY): Payer: Self-pay | Admitting: *Deleted

## 2016-06-24 NOTE — Telephone Encounter (Signed)
Patient given detailed instructions per Stress Test Requisition Sheet for test on 06/27/16 at 2:30.Patient Notified to arrive 30 minutes early, and that it is imperative to arrive on time for appointment to keep from having the test rescheduled.  Patient verbalized understanding. Veronia Beets

## 2016-06-27 ENCOUNTER — Other Ambulatory Visit (HOSPITAL_COMMUNITY): Payer: Medicare Other

## 2016-06-27 ENCOUNTER — Other Ambulatory Visit: Payer: Self-pay

## 2016-06-27 ENCOUNTER — Ambulatory Visit (HOSPITAL_BASED_OUTPATIENT_CLINIC_OR_DEPARTMENT_OTHER): Payer: Medicare Other

## 2016-06-27 ENCOUNTER — Ambulatory Visit (HOSPITAL_COMMUNITY): Payer: Medicare Other | Attending: Cardiology

## 2016-06-27 ENCOUNTER — Ambulatory Visit (HOSPITAL_COMMUNITY): Payer: Medicare Other

## 2016-06-27 DIAGNOSIS — Z7682 Awaiting organ transplant status: Secondary | ICD-10-CM

## 2016-06-27 DIAGNOSIS — I340 Nonrheumatic mitral (valve) insufficiency: Secondary | ICD-10-CM | POA: Diagnosis not present

## 2016-06-27 DIAGNOSIS — I351 Nonrheumatic aortic (valve) insufficiency: Secondary | ICD-10-CM | POA: Diagnosis not present

## 2016-08-02 DIAGNOSIS — N185 Chronic kidney disease, stage 5: Secondary | ICD-10-CM | POA: Diagnosis not present

## 2016-08-02 DIAGNOSIS — N2589 Other disorders resulting from impaired renal tubular function: Secondary | ICD-10-CM | POA: Diagnosis not present

## 2016-08-02 DIAGNOSIS — I120 Hypertensive chronic kidney disease with stage 5 chronic kidney disease or end stage renal disease: Secondary | ICD-10-CM | POA: Diagnosis not present

## 2016-08-02 DIAGNOSIS — M109 Gout, unspecified: Secondary | ICD-10-CM | POA: Diagnosis not present

## 2016-08-02 DIAGNOSIS — D631 Anemia in chronic kidney disease: Secondary | ICD-10-CM | POA: Diagnosis not present

## 2016-08-02 DIAGNOSIS — I770 Arteriovenous fistula, acquired: Secondary | ICD-10-CM | POA: Diagnosis not present

## 2016-08-02 DIAGNOSIS — N529 Male erectile dysfunction, unspecified: Secondary | ICD-10-CM | POA: Diagnosis not present

## 2016-08-02 DIAGNOSIS — K219 Gastro-esophageal reflux disease without esophagitis: Secondary | ICD-10-CM | POA: Diagnosis not present

## 2016-08-02 DIAGNOSIS — N2581 Secondary hyperparathyroidism of renal origin: Secondary | ICD-10-CM | POA: Diagnosis not present

## 2016-08-02 DIAGNOSIS — E785 Hyperlipidemia, unspecified: Secondary | ICD-10-CM | POA: Diagnosis not present

## 2016-08-02 DIAGNOSIS — R809 Proteinuria, unspecified: Secondary | ICD-10-CM | POA: Diagnosis not present

## 2016-08-29 DIAGNOSIS — N185 Chronic kidney disease, stage 5: Secondary | ICD-10-CM | POA: Diagnosis not present

## 2016-10-03 DIAGNOSIS — M109 Gout, unspecified: Secondary | ICD-10-CM | POA: Diagnosis not present

## 2016-10-03 DIAGNOSIS — E785 Hyperlipidemia, unspecified: Secondary | ICD-10-CM | POA: Diagnosis not present

## 2016-10-03 DIAGNOSIS — R809 Proteinuria, unspecified: Secondary | ICD-10-CM | POA: Diagnosis not present

## 2016-10-03 DIAGNOSIS — I770 Arteriovenous fistula, acquired: Secondary | ICD-10-CM | POA: Diagnosis not present

## 2016-10-03 DIAGNOSIS — K219 Gastro-esophageal reflux disease without esophagitis: Secondary | ICD-10-CM | POA: Diagnosis not present

## 2016-10-03 DIAGNOSIS — I120 Hypertensive chronic kidney disease with stage 5 chronic kidney disease or end stage renal disease: Secondary | ICD-10-CM | POA: Diagnosis not present

## 2016-10-03 DIAGNOSIS — N2589 Other disorders resulting from impaired renal tubular function: Secondary | ICD-10-CM | POA: Diagnosis not present

## 2016-10-03 DIAGNOSIS — N2581 Secondary hyperparathyroidism of renal origin: Secondary | ICD-10-CM | POA: Diagnosis not present

## 2016-10-03 DIAGNOSIS — N185 Chronic kidney disease, stage 5: Secondary | ICD-10-CM | POA: Diagnosis not present

## 2016-10-03 DIAGNOSIS — D631 Anemia in chronic kidney disease: Secondary | ICD-10-CM | POA: Diagnosis not present

## 2016-10-11 DIAGNOSIS — N186 End stage renal disease: Secondary | ICD-10-CM | POA: Diagnosis not present

## 2016-10-11 DIAGNOSIS — N2581 Secondary hyperparathyroidism of renal origin: Secondary | ICD-10-CM | POA: Diagnosis not present

## 2016-10-13 DIAGNOSIS — N2581 Secondary hyperparathyroidism of renal origin: Secondary | ICD-10-CM | POA: Diagnosis not present

## 2016-10-13 DIAGNOSIS — N186 End stage renal disease: Secondary | ICD-10-CM | POA: Diagnosis not present

## 2016-10-15 DIAGNOSIS — N2581 Secondary hyperparathyroidism of renal origin: Secondary | ICD-10-CM | POA: Diagnosis not present

## 2016-10-15 DIAGNOSIS — N049 Nephrotic syndrome with unspecified morphologic changes: Secondary | ICD-10-CM | POA: Diagnosis not present

## 2016-10-15 DIAGNOSIS — Z992 Dependence on renal dialysis: Secondary | ICD-10-CM | POA: Diagnosis not present

## 2016-10-15 DIAGNOSIS — N186 End stage renal disease: Secondary | ICD-10-CM | POA: Diagnosis not present

## 2016-10-18 DIAGNOSIS — N186 End stage renal disease: Secondary | ICD-10-CM | POA: Diagnosis not present

## 2016-10-18 DIAGNOSIS — N2581 Secondary hyperparathyroidism of renal origin: Secondary | ICD-10-CM | POA: Diagnosis not present

## 2016-10-20 DIAGNOSIS — N2581 Secondary hyperparathyroidism of renal origin: Secondary | ICD-10-CM | POA: Diagnosis not present

## 2016-10-20 DIAGNOSIS — N186 End stage renal disease: Secondary | ICD-10-CM | POA: Diagnosis not present

## 2016-10-22 DIAGNOSIS — N2581 Secondary hyperparathyroidism of renal origin: Secondary | ICD-10-CM | POA: Diagnosis not present

## 2016-10-22 DIAGNOSIS — N186 End stage renal disease: Secondary | ICD-10-CM | POA: Diagnosis not present

## 2016-10-24 DIAGNOSIS — Z7682 Awaiting organ transplant status: Secondary | ICD-10-CM | POA: Diagnosis not present

## 2016-10-24 DIAGNOSIS — Z125 Encounter for screening for malignant neoplasm of prostate: Secondary | ICD-10-CM | POA: Diagnosis not present

## 2016-10-24 DIAGNOSIS — N185 Chronic kidney disease, stage 5: Secondary | ICD-10-CM | POA: Diagnosis not present

## 2016-10-25 DIAGNOSIS — Z7682 Awaiting organ transplant status: Secondary | ICD-10-CM | POA: Diagnosis not present

## 2016-10-25 DIAGNOSIS — N2581 Secondary hyperparathyroidism of renal origin: Secondary | ICD-10-CM | POA: Diagnosis not present

## 2016-10-25 DIAGNOSIS — N281 Cyst of kidney, acquired: Secondary | ICD-10-CM | POA: Diagnosis not present

## 2016-10-25 DIAGNOSIS — N186 End stage renal disease: Secondary | ICD-10-CM | POA: Diagnosis not present

## 2016-10-25 DIAGNOSIS — Z992 Dependence on renal dialysis: Secondary | ICD-10-CM | POA: Diagnosis not present

## 2016-10-27 DIAGNOSIS — N186 End stage renal disease: Secondary | ICD-10-CM | POA: Diagnosis not present

## 2016-10-27 DIAGNOSIS — N2581 Secondary hyperparathyroidism of renal origin: Secondary | ICD-10-CM | POA: Diagnosis not present

## 2016-10-29 DIAGNOSIS — N2581 Secondary hyperparathyroidism of renal origin: Secondary | ICD-10-CM | POA: Diagnosis not present

## 2016-10-29 DIAGNOSIS — N186 End stage renal disease: Secondary | ICD-10-CM | POA: Diagnosis not present

## 2016-11-01 DIAGNOSIS — N186 End stage renal disease: Secondary | ICD-10-CM | POA: Diagnosis not present

## 2016-11-01 DIAGNOSIS — N2581 Secondary hyperparathyroidism of renal origin: Secondary | ICD-10-CM | POA: Diagnosis not present

## 2016-11-03 DIAGNOSIS — N186 End stage renal disease: Secondary | ICD-10-CM | POA: Diagnosis not present

## 2016-11-03 DIAGNOSIS — N2581 Secondary hyperparathyroidism of renal origin: Secondary | ICD-10-CM | POA: Diagnosis not present

## 2016-11-05 DIAGNOSIS — N2581 Secondary hyperparathyroidism of renal origin: Secondary | ICD-10-CM | POA: Diagnosis not present

## 2016-11-05 DIAGNOSIS — N186 End stage renal disease: Secondary | ICD-10-CM | POA: Diagnosis not present

## 2016-11-08 DIAGNOSIS — N186 End stage renal disease: Secondary | ICD-10-CM | POA: Diagnosis not present

## 2016-11-08 DIAGNOSIS — N2581 Secondary hyperparathyroidism of renal origin: Secondary | ICD-10-CM | POA: Diagnosis not present

## 2016-11-08 DIAGNOSIS — Z992 Dependence on renal dialysis: Secondary | ICD-10-CM | POA: Diagnosis not present

## 2016-11-10 DIAGNOSIS — Z992 Dependence on renal dialysis: Secondary | ICD-10-CM | POA: Diagnosis not present

## 2016-11-10 DIAGNOSIS — N186 End stage renal disease: Secondary | ICD-10-CM | POA: Diagnosis not present

## 2016-11-10 DIAGNOSIS — N2581 Secondary hyperparathyroidism of renal origin: Secondary | ICD-10-CM | POA: Diagnosis not present

## 2016-11-12 DIAGNOSIS — N2581 Secondary hyperparathyroidism of renal origin: Secondary | ICD-10-CM | POA: Diagnosis not present

## 2016-11-12 DIAGNOSIS — N186 End stage renal disease: Secondary | ICD-10-CM | POA: Diagnosis not present

## 2016-11-14 DIAGNOSIS — N049 Nephrotic syndrome with unspecified morphologic changes: Secondary | ICD-10-CM | POA: Diagnosis not present

## 2016-11-14 DIAGNOSIS — N186 End stage renal disease: Secondary | ICD-10-CM | POA: Diagnosis not present

## 2016-11-14 DIAGNOSIS — Z992 Dependence on renal dialysis: Secondary | ICD-10-CM | POA: Diagnosis not present

## 2016-11-15 DIAGNOSIS — D631 Anemia in chronic kidney disease: Secondary | ICD-10-CM | POA: Diagnosis not present

## 2016-11-15 DIAGNOSIS — N186 End stage renal disease: Secondary | ICD-10-CM | POA: Diagnosis not present

## 2016-11-15 DIAGNOSIS — N2581 Secondary hyperparathyroidism of renal origin: Secondary | ICD-10-CM | POA: Diagnosis not present

## 2016-11-17 DIAGNOSIS — D631 Anemia in chronic kidney disease: Secondary | ICD-10-CM | POA: Diagnosis not present

## 2016-11-17 DIAGNOSIS — N186 End stage renal disease: Secondary | ICD-10-CM | POA: Diagnosis not present

## 2016-11-17 DIAGNOSIS — N2581 Secondary hyperparathyroidism of renal origin: Secondary | ICD-10-CM | POA: Diagnosis not present

## 2016-11-19 DIAGNOSIS — D631 Anemia in chronic kidney disease: Secondary | ICD-10-CM | POA: Diagnosis not present

## 2016-11-19 DIAGNOSIS — N186 End stage renal disease: Secondary | ICD-10-CM | POA: Diagnosis not present

## 2016-11-19 DIAGNOSIS — N2581 Secondary hyperparathyroidism of renal origin: Secondary | ICD-10-CM | POA: Diagnosis not present

## 2016-11-22 DIAGNOSIS — N2581 Secondary hyperparathyroidism of renal origin: Secondary | ICD-10-CM | POA: Diagnosis not present

## 2016-11-22 DIAGNOSIS — N186 End stage renal disease: Secondary | ICD-10-CM | POA: Diagnosis not present

## 2016-11-22 DIAGNOSIS — D631 Anemia in chronic kidney disease: Secondary | ICD-10-CM | POA: Diagnosis not present

## 2016-11-24 DIAGNOSIS — N186 End stage renal disease: Secondary | ICD-10-CM | POA: Diagnosis not present

## 2016-11-24 DIAGNOSIS — N2581 Secondary hyperparathyroidism of renal origin: Secondary | ICD-10-CM | POA: Diagnosis not present

## 2016-11-24 DIAGNOSIS — D631 Anemia in chronic kidney disease: Secondary | ICD-10-CM | POA: Diagnosis not present

## 2016-11-26 DIAGNOSIS — N186 End stage renal disease: Secondary | ICD-10-CM | POA: Diagnosis not present

## 2016-11-26 DIAGNOSIS — N2581 Secondary hyperparathyroidism of renal origin: Secondary | ICD-10-CM | POA: Diagnosis not present

## 2016-11-26 DIAGNOSIS — D631 Anemia in chronic kidney disease: Secondary | ICD-10-CM | POA: Diagnosis not present

## 2016-11-29 DIAGNOSIS — N2581 Secondary hyperparathyroidism of renal origin: Secondary | ICD-10-CM | POA: Diagnosis not present

## 2016-11-29 DIAGNOSIS — D631 Anemia in chronic kidney disease: Secondary | ICD-10-CM | POA: Diagnosis not present

## 2016-11-29 DIAGNOSIS — N186 End stage renal disease: Secondary | ICD-10-CM | POA: Diagnosis not present

## 2016-12-01 DIAGNOSIS — D631 Anemia in chronic kidney disease: Secondary | ICD-10-CM | POA: Diagnosis not present

## 2016-12-01 DIAGNOSIS — N186 End stage renal disease: Secondary | ICD-10-CM | POA: Diagnosis not present

## 2016-12-01 DIAGNOSIS — N2581 Secondary hyperparathyroidism of renal origin: Secondary | ICD-10-CM | POA: Diagnosis not present

## 2016-12-03 DIAGNOSIS — D631 Anemia in chronic kidney disease: Secondary | ICD-10-CM | POA: Diagnosis not present

## 2016-12-03 DIAGNOSIS — N186 End stage renal disease: Secondary | ICD-10-CM | POA: Diagnosis not present

## 2016-12-03 DIAGNOSIS — N2581 Secondary hyperparathyroidism of renal origin: Secondary | ICD-10-CM | POA: Diagnosis not present

## 2016-12-06 DIAGNOSIS — D631 Anemia in chronic kidney disease: Secondary | ICD-10-CM | POA: Diagnosis not present

## 2016-12-06 DIAGNOSIS — N2581 Secondary hyperparathyroidism of renal origin: Secondary | ICD-10-CM | POA: Diagnosis not present

## 2016-12-06 DIAGNOSIS — N186 End stage renal disease: Secondary | ICD-10-CM | POA: Diagnosis not present

## 2016-12-08 DIAGNOSIS — N2581 Secondary hyperparathyroidism of renal origin: Secondary | ICD-10-CM | POA: Diagnosis not present

## 2016-12-08 DIAGNOSIS — N186 End stage renal disease: Secondary | ICD-10-CM | POA: Diagnosis not present

## 2016-12-08 DIAGNOSIS — D631 Anemia in chronic kidney disease: Secondary | ICD-10-CM | POA: Diagnosis not present

## 2016-12-10 DIAGNOSIS — N2581 Secondary hyperparathyroidism of renal origin: Secondary | ICD-10-CM | POA: Diagnosis not present

## 2016-12-10 DIAGNOSIS — D631 Anemia in chronic kidney disease: Secondary | ICD-10-CM | POA: Diagnosis not present

## 2016-12-10 DIAGNOSIS — N186 End stage renal disease: Secondary | ICD-10-CM | POA: Diagnosis not present

## 2016-12-13 DIAGNOSIS — N2581 Secondary hyperparathyroidism of renal origin: Secondary | ICD-10-CM | POA: Diagnosis not present

## 2016-12-13 DIAGNOSIS — D631 Anemia in chronic kidney disease: Secondary | ICD-10-CM | POA: Diagnosis not present

## 2016-12-13 DIAGNOSIS — N186 End stage renal disease: Secondary | ICD-10-CM | POA: Diagnosis not present

## 2016-12-15 DIAGNOSIS — N2581 Secondary hyperparathyroidism of renal origin: Secondary | ICD-10-CM | POA: Diagnosis not present

## 2016-12-15 DIAGNOSIS — Z992 Dependence on renal dialysis: Secondary | ICD-10-CM | POA: Diagnosis not present

## 2016-12-15 DIAGNOSIS — N049 Nephrotic syndrome with unspecified morphologic changes: Secondary | ICD-10-CM | POA: Diagnosis not present

## 2016-12-15 DIAGNOSIS — D631 Anemia in chronic kidney disease: Secondary | ICD-10-CM | POA: Diagnosis not present

## 2016-12-15 DIAGNOSIS — N186 End stage renal disease: Secondary | ICD-10-CM | POA: Diagnosis not present

## 2016-12-16 DIAGNOSIS — N186 End stage renal disease: Secondary | ICD-10-CM | POA: Diagnosis not present

## 2016-12-16 DIAGNOSIS — I871 Compression of vein: Secondary | ICD-10-CM | POA: Diagnosis not present

## 2016-12-16 DIAGNOSIS — T82858A Stenosis of vascular prosthetic devices, implants and grafts, initial encounter: Secondary | ICD-10-CM | POA: Diagnosis not present

## 2016-12-16 DIAGNOSIS — Z992 Dependence on renal dialysis: Secondary | ICD-10-CM | POA: Diagnosis not present

## 2016-12-17 DIAGNOSIS — N2581 Secondary hyperparathyroidism of renal origin: Secondary | ICD-10-CM | POA: Diagnosis not present

## 2016-12-17 DIAGNOSIS — D631 Anemia in chronic kidney disease: Secondary | ICD-10-CM | POA: Diagnosis not present

## 2016-12-17 DIAGNOSIS — N186 End stage renal disease: Secondary | ICD-10-CM | POA: Diagnosis not present

## 2016-12-20 DIAGNOSIS — N186 End stage renal disease: Secondary | ICD-10-CM | POA: Diagnosis not present

## 2016-12-20 DIAGNOSIS — N2581 Secondary hyperparathyroidism of renal origin: Secondary | ICD-10-CM | POA: Diagnosis not present

## 2016-12-20 DIAGNOSIS — D631 Anemia in chronic kidney disease: Secondary | ICD-10-CM | POA: Diagnosis not present

## 2016-12-22 DIAGNOSIS — N186 End stage renal disease: Secondary | ICD-10-CM | POA: Diagnosis not present

## 2016-12-22 DIAGNOSIS — D631 Anemia in chronic kidney disease: Secondary | ICD-10-CM | POA: Diagnosis not present

## 2016-12-22 DIAGNOSIS — N2581 Secondary hyperparathyroidism of renal origin: Secondary | ICD-10-CM | POA: Diagnosis not present

## 2016-12-24 DIAGNOSIS — D631 Anemia in chronic kidney disease: Secondary | ICD-10-CM | POA: Diagnosis not present

## 2016-12-24 DIAGNOSIS — N186 End stage renal disease: Secondary | ICD-10-CM | POA: Diagnosis not present

## 2016-12-24 DIAGNOSIS — N2581 Secondary hyperparathyroidism of renal origin: Secondary | ICD-10-CM | POA: Diagnosis not present

## 2016-12-27 DIAGNOSIS — N186 End stage renal disease: Secondary | ICD-10-CM | POA: Diagnosis not present

## 2016-12-27 DIAGNOSIS — D631 Anemia in chronic kidney disease: Secondary | ICD-10-CM | POA: Diagnosis not present

## 2016-12-27 DIAGNOSIS — N2581 Secondary hyperparathyroidism of renal origin: Secondary | ICD-10-CM | POA: Diagnosis not present

## 2016-12-29 DIAGNOSIS — N2581 Secondary hyperparathyroidism of renal origin: Secondary | ICD-10-CM | POA: Diagnosis not present

## 2016-12-29 DIAGNOSIS — D631 Anemia in chronic kidney disease: Secondary | ICD-10-CM | POA: Diagnosis not present

## 2016-12-29 DIAGNOSIS — N186 End stage renal disease: Secondary | ICD-10-CM | POA: Diagnosis not present

## 2016-12-31 DIAGNOSIS — D631 Anemia in chronic kidney disease: Secondary | ICD-10-CM | POA: Diagnosis not present

## 2016-12-31 DIAGNOSIS — N2581 Secondary hyperparathyroidism of renal origin: Secondary | ICD-10-CM | POA: Diagnosis not present

## 2016-12-31 DIAGNOSIS — N186 End stage renal disease: Secondary | ICD-10-CM | POA: Diagnosis not present

## 2017-01-03 DIAGNOSIS — N186 End stage renal disease: Secondary | ICD-10-CM | POA: Diagnosis not present

## 2017-01-03 DIAGNOSIS — D631 Anemia in chronic kidney disease: Secondary | ICD-10-CM | POA: Diagnosis not present

## 2017-01-03 DIAGNOSIS — N2581 Secondary hyperparathyroidism of renal origin: Secondary | ICD-10-CM | POA: Diagnosis not present

## 2017-01-05 DIAGNOSIS — N186 End stage renal disease: Secondary | ICD-10-CM | POA: Diagnosis not present

## 2017-01-05 DIAGNOSIS — D631 Anemia in chronic kidney disease: Secondary | ICD-10-CM | POA: Diagnosis not present

## 2017-01-05 DIAGNOSIS — N2581 Secondary hyperparathyroidism of renal origin: Secondary | ICD-10-CM | POA: Diagnosis not present

## 2017-01-07 DIAGNOSIS — N2581 Secondary hyperparathyroidism of renal origin: Secondary | ICD-10-CM | POA: Diagnosis not present

## 2017-01-07 DIAGNOSIS — D631 Anemia in chronic kidney disease: Secondary | ICD-10-CM | POA: Diagnosis not present

## 2017-01-07 DIAGNOSIS — N186 End stage renal disease: Secondary | ICD-10-CM | POA: Diagnosis not present

## 2017-01-10 DIAGNOSIS — N2581 Secondary hyperparathyroidism of renal origin: Secondary | ICD-10-CM | POA: Diagnosis not present

## 2017-01-10 DIAGNOSIS — N186 End stage renal disease: Secondary | ICD-10-CM | POA: Diagnosis not present

## 2017-01-10 DIAGNOSIS — D631 Anemia in chronic kidney disease: Secondary | ICD-10-CM | POA: Diagnosis not present

## 2017-01-12 DIAGNOSIS — N2581 Secondary hyperparathyroidism of renal origin: Secondary | ICD-10-CM | POA: Diagnosis not present

## 2017-01-12 DIAGNOSIS — D631 Anemia in chronic kidney disease: Secondary | ICD-10-CM | POA: Diagnosis not present

## 2017-01-12 DIAGNOSIS — N186 End stage renal disease: Secondary | ICD-10-CM | POA: Diagnosis not present

## 2017-01-14 DIAGNOSIS — N049 Nephrotic syndrome with unspecified morphologic changes: Secondary | ICD-10-CM | POA: Diagnosis not present

## 2017-01-14 DIAGNOSIS — Z992 Dependence on renal dialysis: Secondary | ICD-10-CM | POA: Diagnosis not present

## 2017-01-14 DIAGNOSIS — N2581 Secondary hyperparathyroidism of renal origin: Secondary | ICD-10-CM | POA: Diagnosis not present

## 2017-01-14 DIAGNOSIS — N186 End stage renal disease: Secondary | ICD-10-CM | POA: Diagnosis not present

## 2017-01-14 DIAGNOSIS — D631 Anemia in chronic kidney disease: Secondary | ICD-10-CM | POA: Diagnosis not present

## 2017-01-17 DIAGNOSIS — D631 Anemia in chronic kidney disease: Secondary | ICD-10-CM | POA: Diagnosis not present

## 2017-01-17 DIAGNOSIS — N186 End stage renal disease: Secondary | ICD-10-CM | POA: Diagnosis not present

## 2017-01-17 DIAGNOSIS — N2581 Secondary hyperparathyroidism of renal origin: Secondary | ICD-10-CM | POA: Diagnosis not present

## 2017-01-19 DIAGNOSIS — N2581 Secondary hyperparathyroidism of renal origin: Secondary | ICD-10-CM | POA: Diagnosis not present

## 2017-01-19 DIAGNOSIS — N186 End stage renal disease: Secondary | ICD-10-CM | POA: Diagnosis not present

## 2017-01-19 DIAGNOSIS — D631 Anemia in chronic kidney disease: Secondary | ICD-10-CM | POA: Diagnosis not present

## 2017-01-24 DIAGNOSIS — N2581 Secondary hyperparathyroidism of renal origin: Secondary | ICD-10-CM | POA: Diagnosis not present

## 2017-01-24 DIAGNOSIS — N186 End stage renal disease: Secondary | ICD-10-CM | POA: Diagnosis not present

## 2017-01-24 DIAGNOSIS — D631 Anemia in chronic kidney disease: Secondary | ICD-10-CM | POA: Diagnosis not present

## 2017-01-26 DIAGNOSIS — N186 End stage renal disease: Secondary | ICD-10-CM | POA: Diagnosis not present

## 2017-01-26 DIAGNOSIS — D631 Anemia in chronic kidney disease: Secondary | ICD-10-CM | POA: Diagnosis not present

## 2017-01-26 DIAGNOSIS — N2581 Secondary hyperparathyroidism of renal origin: Secondary | ICD-10-CM | POA: Diagnosis not present

## 2017-01-31 DIAGNOSIS — N2581 Secondary hyperparathyroidism of renal origin: Secondary | ICD-10-CM | POA: Diagnosis not present

## 2017-01-31 DIAGNOSIS — N186 End stage renal disease: Secondary | ICD-10-CM | POA: Diagnosis not present

## 2017-01-31 DIAGNOSIS — D631 Anemia in chronic kidney disease: Secondary | ICD-10-CM | POA: Diagnosis not present

## 2017-02-02 DIAGNOSIS — N186 End stage renal disease: Secondary | ICD-10-CM | POA: Diagnosis not present

## 2017-02-02 DIAGNOSIS — N2581 Secondary hyperparathyroidism of renal origin: Secondary | ICD-10-CM | POA: Diagnosis not present

## 2017-02-02 DIAGNOSIS — D631 Anemia in chronic kidney disease: Secondary | ICD-10-CM | POA: Diagnosis not present

## 2017-02-04 DIAGNOSIS — D631 Anemia in chronic kidney disease: Secondary | ICD-10-CM | POA: Diagnosis not present

## 2017-02-04 DIAGNOSIS — N186 End stage renal disease: Secondary | ICD-10-CM | POA: Diagnosis not present

## 2017-02-04 DIAGNOSIS — N2581 Secondary hyperparathyroidism of renal origin: Secondary | ICD-10-CM | POA: Diagnosis not present

## 2017-02-07 DIAGNOSIS — N2581 Secondary hyperparathyroidism of renal origin: Secondary | ICD-10-CM | POA: Diagnosis not present

## 2017-02-07 DIAGNOSIS — N186 End stage renal disease: Secondary | ICD-10-CM | POA: Diagnosis not present

## 2017-02-07 DIAGNOSIS — D631 Anemia in chronic kidney disease: Secondary | ICD-10-CM | POA: Diagnosis not present

## 2017-02-09 DIAGNOSIS — D631 Anemia in chronic kidney disease: Secondary | ICD-10-CM | POA: Diagnosis not present

## 2017-02-09 DIAGNOSIS — N2581 Secondary hyperparathyroidism of renal origin: Secondary | ICD-10-CM | POA: Diagnosis not present

## 2017-02-09 DIAGNOSIS — N186 End stage renal disease: Secondary | ICD-10-CM | POA: Diagnosis not present

## 2017-02-11 DIAGNOSIS — N186 End stage renal disease: Secondary | ICD-10-CM | POA: Diagnosis not present

## 2017-02-11 DIAGNOSIS — D631 Anemia in chronic kidney disease: Secondary | ICD-10-CM | POA: Diagnosis not present

## 2017-02-11 DIAGNOSIS — N2581 Secondary hyperparathyroidism of renal origin: Secondary | ICD-10-CM | POA: Diagnosis not present

## 2017-02-14 DIAGNOSIS — N049 Nephrotic syndrome with unspecified morphologic changes: Secondary | ICD-10-CM | POA: Diagnosis not present

## 2017-02-14 DIAGNOSIS — N186 End stage renal disease: Secondary | ICD-10-CM | POA: Diagnosis not present

## 2017-02-14 DIAGNOSIS — N2581 Secondary hyperparathyroidism of renal origin: Secondary | ICD-10-CM | POA: Diagnosis not present

## 2017-02-14 DIAGNOSIS — D631 Anemia in chronic kidney disease: Secondary | ICD-10-CM | POA: Diagnosis not present

## 2017-02-14 DIAGNOSIS — Z992 Dependence on renal dialysis: Secondary | ICD-10-CM | POA: Diagnosis not present

## 2017-02-16 DIAGNOSIS — N186 End stage renal disease: Secondary | ICD-10-CM | POA: Diagnosis not present

## 2017-02-16 DIAGNOSIS — D631 Anemia in chronic kidney disease: Secondary | ICD-10-CM | POA: Diagnosis not present

## 2017-02-16 DIAGNOSIS — N2581 Secondary hyperparathyroidism of renal origin: Secondary | ICD-10-CM | POA: Diagnosis not present

## 2017-02-18 DIAGNOSIS — D631 Anemia in chronic kidney disease: Secondary | ICD-10-CM | POA: Diagnosis not present

## 2017-02-18 DIAGNOSIS — N2581 Secondary hyperparathyroidism of renal origin: Secondary | ICD-10-CM | POA: Diagnosis not present

## 2017-02-18 DIAGNOSIS — N186 End stage renal disease: Secondary | ICD-10-CM | POA: Diagnosis not present

## 2017-02-21 DIAGNOSIS — D631 Anemia in chronic kidney disease: Secondary | ICD-10-CM | POA: Diagnosis not present

## 2017-02-21 DIAGNOSIS — N2581 Secondary hyperparathyroidism of renal origin: Secondary | ICD-10-CM | POA: Diagnosis not present

## 2017-02-21 DIAGNOSIS — N186 End stage renal disease: Secondary | ICD-10-CM | POA: Diagnosis not present

## 2017-02-23 DIAGNOSIS — N2581 Secondary hyperparathyroidism of renal origin: Secondary | ICD-10-CM | POA: Diagnosis not present

## 2017-02-23 DIAGNOSIS — D631 Anemia in chronic kidney disease: Secondary | ICD-10-CM | POA: Diagnosis not present

## 2017-02-23 DIAGNOSIS — N186 End stage renal disease: Secondary | ICD-10-CM | POA: Diagnosis not present

## 2017-02-25 DIAGNOSIS — N2581 Secondary hyperparathyroidism of renal origin: Secondary | ICD-10-CM | POA: Diagnosis not present

## 2017-02-25 DIAGNOSIS — D631 Anemia in chronic kidney disease: Secondary | ICD-10-CM | POA: Diagnosis not present

## 2017-02-25 DIAGNOSIS — N186 End stage renal disease: Secondary | ICD-10-CM | POA: Diagnosis not present

## 2017-02-28 DIAGNOSIS — D631 Anemia in chronic kidney disease: Secondary | ICD-10-CM | POA: Diagnosis not present

## 2017-02-28 DIAGNOSIS — N186 End stage renal disease: Secondary | ICD-10-CM | POA: Diagnosis not present

## 2017-02-28 DIAGNOSIS — N2581 Secondary hyperparathyroidism of renal origin: Secondary | ICD-10-CM | POA: Diagnosis not present

## 2017-03-02 DIAGNOSIS — N2581 Secondary hyperparathyroidism of renal origin: Secondary | ICD-10-CM | POA: Diagnosis not present

## 2017-03-02 DIAGNOSIS — D631 Anemia in chronic kidney disease: Secondary | ICD-10-CM | POA: Diagnosis not present

## 2017-03-02 DIAGNOSIS — N186 End stage renal disease: Secondary | ICD-10-CM | POA: Diagnosis not present

## 2017-03-04 DIAGNOSIS — N186 End stage renal disease: Secondary | ICD-10-CM | POA: Diagnosis not present

## 2017-03-04 DIAGNOSIS — D631 Anemia in chronic kidney disease: Secondary | ICD-10-CM | POA: Diagnosis not present

## 2017-03-04 DIAGNOSIS — N2581 Secondary hyperparathyroidism of renal origin: Secondary | ICD-10-CM | POA: Diagnosis not present

## 2017-03-09 DIAGNOSIS — N2581 Secondary hyperparathyroidism of renal origin: Secondary | ICD-10-CM | POA: Diagnosis not present

## 2017-03-09 DIAGNOSIS — N186 End stage renal disease: Secondary | ICD-10-CM | POA: Diagnosis not present

## 2017-03-09 DIAGNOSIS — D631 Anemia in chronic kidney disease: Secondary | ICD-10-CM | POA: Diagnosis not present

## 2017-03-11 DIAGNOSIS — D631 Anemia in chronic kidney disease: Secondary | ICD-10-CM | POA: Diagnosis not present

## 2017-03-11 DIAGNOSIS — N186 End stage renal disease: Secondary | ICD-10-CM | POA: Diagnosis not present

## 2017-03-11 DIAGNOSIS — N2581 Secondary hyperparathyroidism of renal origin: Secondary | ICD-10-CM | POA: Diagnosis not present

## 2017-03-14 DIAGNOSIS — N186 End stage renal disease: Secondary | ICD-10-CM | POA: Diagnosis not present

## 2017-03-14 DIAGNOSIS — D631 Anemia in chronic kidney disease: Secondary | ICD-10-CM | POA: Diagnosis not present

## 2017-03-14 DIAGNOSIS — N2581 Secondary hyperparathyroidism of renal origin: Secondary | ICD-10-CM | POA: Diagnosis not present

## 2017-03-16 DIAGNOSIS — N186 End stage renal disease: Secondary | ICD-10-CM | POA: Diagnosis not present

## 2017-03-16 DIAGNOSIS — D631 Anemia in chronic kidney disease: Secondary | ICD-10-CM | POA: Diagnosis not present

## 2017-03-16 DIAGNOSIS — N2581 Secondary hyperparathyroidism of renal origin: Secondary | ICD-10-CM | POA: Diagnosis not present

## 2017-03-17 DIAGNOSIS — Z992 Dependence on renal dialysis: Secondary | ICD-10-CM | POA: Diagnosis not present

## 2017-03-17 DIAGNOSIS — N186 End stage renal disease: Secondary | ICD-10-CM | POA: Diagnosis not present

## 2017-03-17 DIAGNOSIS — N049 Nephrotic syndrome with unspecified morphologic changes: Secondary | ICD-10-CM | POA: Diagnosis not present

## 2017-03-18 DIAGNOSIS — N186 End stage renal disease: Secondary | ICD-10-CM | POA: Diagnosis not present

## 2017-03-18 DIAGNOSIS — N2581 Secondary hyperparathyroidism of renal origin: Secondary | ICD-10-CM | POA: Diagnosis not present

## 2017-03-18 DIAGNOSIS — D631 Anemia in chronic kidney disease: Secondary | ICD-10-CM | POA: Diagnosis not present

## 2017-03-21 DIAGNOSIS — D631 Anemia in chronic kidney disease: Secondary | ICD-10-CM | POA: Diagnosis not present

## 2017-03-21 DIAGNOSIS — N2581 Secondary hyperparathyroidism of renal origin: Secondary | ICD-10-CM | POA: Diagnosis not present

## 2017-03-21 DIAGNOSIS — N186 End stage renal disease: Secondary | ICD-10-CM | POA: Diagnosis not present

## 2017-03-23 DIAGNOSIS — N186 End stage renal disease: Secondary | ICD-10-CM | POA: Diagnosis not present

## 2017-03-23 DIAGNOSIS — N2581 Secondary hyperparathyroidism of renal origin: Secondary | ICD-10-CM | POA: Diagnosis not present

## 2017-03-23 DIAGNOSIS — D631 Anemia in chronic kidney disease: Secondary | ICD-10-CM | POA: Diagnosis not present

## 2017-03-25 DIAGNOSIS — N2581 Secondary hyperparathyroidism of renal origin: Secondary | ICD-10-CM | POA: Diagnosis not present

## 2017-03-25 DIAGNOSIS — D631 Anemia in chronic kidney disease: Secondary | ICD-10-CM | POA: Diagnosis not present

## 2017-03-25 DIAGNOSIS — N186 End stage renal disease: Secondary | ICD-10-CM | POA: Diagnosis not present

## 2017-03-28 DIAGNOSIS — N2581 Secondary hyperparathyroidism of renal origin: Secondary | ICD-10-CM | POA: Diagnosis not present

## 2017-03-28 DIAGNOSIS — N186 End stage renal disease: Secondary | ICD-10-CM | POA: Diagnosis not present

## 2017-03-28 DIAGNOSIS — D631 Anemia in chronic kidney disease: Secondary | ICD-10-CM | POA: Diagnosis not present

## 2017-03-30 DIAGNOSIS — N186 End stage renal disease: Secondary | ICD-10-CM | POA: Diagnosis not present

## 2017-03-30 DIAGNOSIS — N2581 Secondary hyperparathyroidism of renal origin: Secondary | ICD-10-CM | POA: Diagnosis not present

## 2017-03-30 DIAGNOSIS — D631 Anemia in chronic kidney disease: Secondary | ICD-10-CM | POA: Diagnosis not present

## 2017-04-04 DIAGNOSIS — N186 End stage renal disease: Secondary | ICD-10-CM | POA: Diagnosis not present

## 2017-04-04 DIAGNOSIS — D631 Anemia in chronic kidney disease: Secondary | ICD-10-CM | POA: Diagnosis not present

## 2017-04-04 DIAGNOSIS — N2581 Secondary hyperparathyroidism of renal origin: Secondary | ICD-10-CM | POA: Diagnosis not present

## 2017-04-06 DIAGNOSIS — D631 Anemia in chronic kidney disease: Secondary | ICD-10-CM | POA: Diagnosis not present

## 2017-04-06 DIAGNOSIS — N2581 Secondary hyperparathyroidism of renal origin: Secondary | ICD-10-CM | POA: Diagnosis not present

## 2017-04-06 DIAGNOSIS — N186 End stage renal disease: Secondary | ICD-10-CM | POA: Diagnosis not present

## 2017-04-08 DIAGNOSIS — N186 End stage renal disease: Secondary | ICD-10-CM | POA: Diagnosis not present

## 2017-04-08 DIAGNOSIS — D631 Anemia in chronic kidney disease: Secondary | ICD-10-CM | POA: Diagnosis not present

## 2017-04-08 DIAGNOSIS — N2581 Secondary hyperparathyroidism of renal origin: Secondary | ICD-10-CM | POA: Diagnosis not present

## 2017-04-11 DIAGNOSIS — N2581 Secondary hyperparathyroidism of renal origin: Secondary | ICD-10-CM | POA: Diagnosis not present

## 2017-04-11 DIAGNOSIS — D599 Acquired hemolytic anemia, unspecified: Secondary | ICD-10-CM | POA: Diagnosis not present

## 2017-04-11 DIAGNOSIS — Z992 Dependence on renal dialysis: Secondary | ICD-10-CM | POA: Diagnosis not present

## 2017-04-11 DIAGNOSIS — I10 Essential (primary) hypertension: Secondary | ICD-10-CM | POA: Diagnosis not present

## 2017-04-11 DIAGNOSIS — Z8349 Family history of other endocrine, nutritional and metabolic diseases: Secondary | ICD-10-CM | POA: Diagnosis not present

## 2017-04-11 DIAGNOSIS — N186 End stage renal disease: Secondary | ICD-10-CM | POA: Diagnosis not present

## 2017-04-11 DIAGNOSIS — E7800 Pure hypercholesterolemia, unspecified: Secondary | ICD-10-CM | POA: Diagnosis not present

## 2017-04-11 DIAGNOSIS — Z0000 Encounter for general adult medical examination without abnormal findings: Secondary | ICD-10-CM | POA: Diagnosis not present

## 2017-04-11 DIAGNOSIS — H903 Sensorineural hearing loss, bilateral: Secondary | ICD-10-CM | POA: Diagnosis not present

## 2017-04-11 DIAGNOSIS — D631 Anemia in chronic kidney disease: Secondary | ICD-10-CM | POA: Diagnosis not present

## 2017-04-11 DIAGNOSIS — Z125 Encounter for screening for malignant neoplasm of prostate: Secondary | ICD-10-CM | POA: Diagnosis not present

## 2017-04-13 DIAGNOSIS — D631 Anemia in chronic kidney disease: Secondary | ICD-10-CM | POA: Diagnosis not present

## 2017-04-13 DIAGNOSIS — N2581 Secondary hyperparathyroidism of renal origin: Secondary | ICD-10-CM | POA: Diagnosis not present

## 2017-04-13 DIAGNOSIS — N186 End stage renal disease: Secondary | ICD-10-CM | POA: Diagnosis not present

## 2017-04-15 DIAGNOSIS — D631 Anemia in chronic kidney disease: Secondary | ICD-10-CM | POA: Diagnosis not present

## 2017-04-15 DIAGNOSIS — N186 End stage renal disease: Secondary | ICD-10-CM | POA: Diagnosis not present

## 2017-04-15 DIAGNOSIS — N2581 Secondary hyperparathyroidism of renal origin: Secondary | ICD-10-CM | POA: Diagnosis not present

## 2017-04-16 DIAGNOSIS — N186 End stage renal disease: Secondary | ICD-10-CM | POA: Diagnosis not present

## 2017-04-16 DIAGNOSIS — N049 Nephrotic syndrome with unspecified morphologic changes: Secondary | ICD-10-CM | POA: Diagnosis not present

## 2017-04-16 DIAGNOSIS — Z992 Dependence on renal dialysis: Secondary | ICD-10-CM | POA: Diagnosis not present

## 2017-04-18 DIAGNOSIS — N186 End stage renal disease: Secondary | ICD-10-CM | POA: Diagnosis not present

## 2017-04-18 DIAGNOSIS — N2581 Secondary hyperparathyroidism of renal origin: Secondary | ICD-10-CM | POA: Diagnosis not present

## 2017-04-18 DIAGNOSIS — D631 Anemia in chronic kidney disease: Secondary | ICD-10-CM | POA: Diagnosis not present

## 2017-04-20 DIAGNOSIS — D631 Anemia in chronic kidney disease: Secondary | ICD-10-CM | POA: Diagnosis not present

## 2017-04-20 DIAGNOSIS — N2581 Secondary hyperparathyroidism of renal origin: Secondary | ICD-10-CM | POA: Diagnosis not present

## 2017-04-20 DIAGNOSIS — N186 End stage renal disease: Secondary | ICD-10-CM | POA: Diagnosis not present

## 2017-04-22 DIAGNOSIS — N2581 Secondary hyperparathyroidism of renal origin: Secondary | ICD-10-CM | POA: Diagnosis not present

## 2017-04-22 DIAGNOSIS — N186 End stage renal disease: Secondary | ICD-10-CM | POA: Diagnosis not present

## 2017-04-22 DIAGNOSIS — D631 Anemia in chronic kidney disease: Secondary | ICD-10-CM | POA: Diagnosis not present

## 2017-04-25 DIAGNOSIS — N186 End stage renal disease: Secondary | ICD-10-CM | POA: Diagnosis not present

## 2017-04-25 DIAGNOSIS — D631 Anemia in chronic kidney disease: Secondary | ICD-10-CM | POA: Diagnosis not present

## 2017-04-25 DIAGNOSIS — N2581 Secondary hyperparathyroidism of renal origin: Secondary | ICD-10-CM | POA: Diagnosis not present

## 2017-04-27 DIAGNOSIS — D631 Anemia in chronic kidney disease: Secondary | ICD-10-CM | POA: Diagnosis not present

## 2017-04-27 DIAGNOSIS — N186 End stage renal disease: Secondary | ICD-10-CM | POA: Diagnosis not present

## 2017-04-27 DIAGNOSIS — N2581 Secondary hyperparathyroidism of renal origin: Secondary | ICD-10-CM | POA: Diagnosis not present

## 2017-04-29 DIAGNOSIS — N2581 Secondary hyperparathyroidism of renal origin: Secondary | ICD-10-CM | POA: Diagnosis not present

## 2017-04-29 DIAGNOSIS — D631 Anemia in chronic kidney disease: Secondary | ICD-10-CM | POA: Diagnosis not present

## 2017-04-29 DIAGNOSIS — N186 End stage renal disease: Secondary | ICD-10-CM | POA: Diagnosis not present

## 2017-05-02 DIAGNOSIS — N186 End stage renal disease: Secondary | ICD-10-CM | POA: Diagnosis not present

## 2017-05-02 DIAGNOSIS — D631 Anemia in chronic kidney disease: Secondary | ICD-10-CM | POA: Diagnosis not present

## 2017-05-02 DIAGNOSIS — N2581 Secondary hyperparathyroidism of renal origin: Secondary | ICD-10-CM | POA: Diagnosis not present

## 2017-05-04 DIAGNOSIS — N186 End stage renal disease: Secondary | ICD-10-CM | POA: Diagnosis not present

## 2017-05-04 DIAGNOSIS — N2581 Secondary hyperparathyroidism of renal origin: Secondary | ICD-10-CM | POA: Diagnosis not present

## 2017-05-04 DIAGNOSIS — D631 Anemia in chronic kidney disease: Secondary | ICD-10-CM | POA: Diagnosis not present

## 2017-05-06 DIAGNOSIS — N2581 Secondary hyperparathyroidism of renal origin: Secondary | ICD-10-CM | POA: Diagnosis not present

## 2017-05-06 DIAGNOSIS — D631 Anemia in chronic kidney disease: Secondary | ICD-10-CM | POA: Diagnosis not present

## 2017-05-06 DIAGNOSIS — N186 End stage renal disease: Secondary | ICD-10-CM | POA: Diagnosis not present

## 2017-05-09 DIAGNOSIS — D631 Anemia in chronic kidney disease: Secondary | ICD-10-CM | POA: Diagnosis not present

## 2017-05-09 DIAGNOSIS — N186 End stage renal disease: Secondary | ICD-10-CM | POA: Diagnosis not present

## 2017-05-09 DIAGNOSIS — N2581 Secondary hyperparathyroidism of renal origin: Secondary | ICD-10-CM | POA: Diagnosis not present

## 2017-05-11 DIAGNOSIS — N2581 Secondary hyperparathyroidism of renal origin: Secondary | ICD-10-CM | POA: Diagnosis not present

## 2017-05-11 DIAGNOSIS — D631 Anemia in chronic kidney disease: Secondary | ICD-10-CM | POA: Diagnosis not present

## 2017-05-11 DIAGNOSIS — N186 End stage renal disease: Secondary | ICD-10-CM | POA: Diagnosis not present

## 2017-05-13 DIAGNOSIS — N186 End stage renal disease: Secondary | ICD-10-CM | POA: Diagnosis not present

## 2017-05-13 DIAGNOSIS — D631 Anemia in chronic kidney disease: Secondary | ICD-10-CM | POA: Diagnosis not present

## 2017-05-13 DIAGNOSIS — N2581 Secondary hyperparathyroidism of renal origin: Secondary | ICD-10-CM | POA: Diagnosis not present

## 2017-05-16 DIAGNOSIS — N186 End stage renal disease: Secondary | ICD-10-CM | POA: Diagnosis not present

## 2017-05-16 DIAGNOSIS — N2581 Secondary hyperparathyroidism of renal origin: Secondary | ICD-10-CM | POA: Diagnosis not present

## 2017-05-16 DIAGNOSIS — D631 Anemia in chronic kidney disease: Secondary | ICD-10-CM | POA: Diagnosis not present

## 2017-05-17 DIAGNOSIS — N049 Nephrotic syndrome with unspecified morphologic changes: Secondary | ICD-10-CM | POA: Diagnosis not present

## 2017-05-17 DIAGNOSIS — Z992 Dependence on renal dialysis: Secondary | ICD-10-CM | POA: Diagnosis not present

## 2017-05-17 DIAGNOSIS — N186 End stage renal disease: Secondary | ICD-10-CM | POA: Diagnosis not present

## 2017-05-18 DIAGNOSIS — N2581 Secondary hyperparathyroidism of renal origin: Secondary | ICD-10-CM | POA: Diagnosis not present

## 2017-05-18 DIAGNOSIS — N186 End stage renal disease: Secondary | ICD-10-CM | POA: Diagnosis not present

## 2017-05-18 DIAGNOSIS — D631 Anemia in chronic kidney disease: Secondary | ICD-10-CM | POA: Diagnosis not present

## 2017-05-23 DIAGNOSIS — N186 End stage renal disease: Secondary | ICD-10-CM | POA: Diagnosis not present

## 2017-05-23 DIAGNOSIS — D631 Anemia in chronic kidney disease: Secondary | ICD-10-CM | POA: Diagnosis not present

## 2017-05-23 DIAGNOSIS — N2581 Secondary hyperparathyroidism of renal origin: Secondary | ICD-10-CM | POA: Diagnosis not present

## 2017-05-25 DIAGNOSIS — N186 End stage renal disease: Secondary | ICD-10-CM | POA: Diagnosis not present

## 2017-05-25 DIAGNOSIS — D631 Anemia in chronic kidney disease: Secondary | ICD-10-CM | POA: Diagnosis not present

## 2017-05-25 DIAGNOSIS — N2581 Secondary hyperparathyroidism of renal origin: Secondary | ICD-10-CM | POA: Diagnosis not present

## 2017-05-29 DIAGNOSIS — N186 End stage renal disease: Secondary | ICD-10-CM | POA: Diagnosis not present

## 2017-05-29 DIAGNOSIS — D631 Anemia in chronic kidney disease: Secondary | ICD-10-CM | POA: Diagnosis not present

## 2017-05-29 DIAGNOSIS — N2581 Secondary hyperparathyroidism of renal origin: Secondary | ICD-10-CM | POA: Diagnosis not present

## 2017-06-01 DIAGNOSIS — N186 End stage renal disease: Secondary | ICD-10-CM | POA: Diagnosis not present

## 2017-06-01 DIAGNOSIS — N2581 Secondary hyperparathyroidism of renal origin: Secondary | ICD-10-CM | POA: Diagnosis not present

## 2017-06-01 DIAGNOSIS — D631 Anemia in chronic kidney disease: Secondary | ICD-10-CM | POA: Diagnosis not present

## 2017-06-03 DIAGNOSIS — D631 Anemia in chronic kidney disease: Secondary | ICD-10-CM | POA: Diagnosis not present

## 2017-06-03 DIAGNOSIS — N186 End stage renal disease: Secondary | ICD-10-CM | POA: Diagnosis not present

## 2017-06-03 DIAGNOSIS — N2581 Secondary hyperparathyroidism of renal origin: Secondary | ICD-10-CM | POA: Diagnosis not present

## 2017-06-05 DIAGNOSIS — N186 End stage renal disease: Secondary | ICD-10-CM | POA: Diagnosis not present

## 2017-06-05 DIAGNOSIS — N2581 Secondary hyperparathyroidism of renal origin: Secondary | ICD-10-CM | POA: Diagnosis not present

## 2017-06-05 DIAGNOSIS — D631 Anemia in chronic kidney disease: Secondary | ICD-10-CM | POA: Diagnosis not present

## 2017-06-07 DIAGNOSIS — N2581 Secondary hyperparathyroidism of renal origin: Secondary | ICD-10-CM | POA: Diagnosis not present

## 2017-06-07 DIAGNOSIS — D631 Anemia in chronic kidney disease: Secondary | ICD-10-CM | POA: Diagnosis not present

## 2017-06-07 DIAGNOSIS — N186 End stage renal disease: Secondary | ICD-10-CM | POA: Diagnosis not present

## 2017-06-10 DIAGNOSIS — N2581 Secondary hyperparathyroidism of renal origin: Secondary | ICD-10-CM | POA: Diagnosis not present

## 2017-06-10 DIAGNOSIS — D631 Anemia in chronic kidney disease: Secondary | ICD-10-CM | POA: Diagnosis not present

## 2017-06-10 DIAGNOSIS — N186 End stage renal disease: Secondary | ICD-10-CM | POA: Diagnosis not present

## 2017-06-13 DIAGNOSIS — N2581 Secondary hyperparathyroidism of renal origin: Secondary | ICD-10-CM | POA: Diagnosis not present

## 2017-06-13 DIAGNOSIS — D631 Anemia in chronic kidney disease: Secondary | ICD-10-CM | POA: Diagnosis not present

## 2017-06-13 DIAGNOSIS — N186 End stage renal disease: Secondary | ICD-10-CM | POA: Diagnosis not present

## 2017-06-15 DIAGNOSIS — N186 End stage renal disease: Secondary | ICD-10-CM | POA: Diagnosis not present

## 2017-06-15 DIAGNOSIS — N2581 Secondary hyperparathyroidism of renal origin: Secondary | ICD-10-CM | POA: Diagnosis not present

## 2017-06-15 DIAGNOSIS — D631 Anemia in chronic kidney disease: Secondary | ICD-10-CM | POA: Diagnosis not present

## 2017-06-16 DIAGNOSIS — Z992 Dependence on renal dialysis: Secondary | ICD-10-CM | POA: Diagnosis not present

## 2017-06-16 DIAGNOSIS — N049 Nephrotic syndrome with unspecified morphologic changes: Secondary | ICD-10-CM | POA: Diagnosis not present

## 2017-06-16 DIAGNOSIS — N186 End stage renal disease: Secondary | ICD-10-CM | POA: Diagnosis not present

## 2017-06-17 DIAGNOSIS — N186 End stage renal disease: Secondary | ICD-10-CM | POA: Diagnosis not present

## 2017-06-17 DIAGNOSIS — N2581 Secondary hyperparathyroidism of renal origin: Secondary | ICD-10-CM | POA: Diagnosis not present

## 2017-06-17 DIAGNOSIS — D631 Anemia in chronic kidney disease: Secondary | ICD-10-CM | POA: Diagnosis not present

## 2017-06-20 DIAGNOSIS — N186 End stage renal disease: Secondary | ICD-10-CM | POA: Diagnosis not present

## 2017-06-20 DIAGNOSIS — D631 Anemia in chronic kidney disease: Secondary | ICD-10-CM | POA: Diagnosis not present

## 2017-06-20 DIAGNOSIS — N2581 Secondary hyperparathyroidism of renal origin: Secondary | ICD-10-CM | POA: Diagnosis not present

## 2017-06-22 DIAGNOSIS — D631 Anemia in chronic kidney disease: Secondary | ICD-10-CM | POA: Diagnosis not present

## 2017-06-22 DIAGNOSIS — N2581 Secondary hyperparathyroidism of renal origin: Secondary | ICD-10-CM | POA: Diagnosis not present

## 2017-06-22 DIAGNOSIS — N186 End stage renal disease: Secondary | ICD-10-CM | POA: Diagnosis not present

## 2017-06-24 DIAGNOSIS — N186 End stage renal disease: Secondary | ICD-10-CM | POA: Diagnosis not present

## 2017-06-24 DIAGNOSIS — N2581 Secondary hyperparathyroidism of renal origin: Secondary | ICD-10-CM | POA: Diagnosis not present

## 2017-06-24 DIAGNOSIS — D631 Anemia in chronic kidney disease: Secondary | ICD-10-CM | POA: Diagnosis not present

## 2017-06-27 DIAGNOSIS — N2581 Secondary hyperparathyroidism of renal origin: Secondary | ICD-10-CM | POA: Diagnosis not present

## 2017-06-27 DIAGNOSIS — N186 End stage renal disease: Secondary | ICD-10-CM | POA: Diagnosis not present

## 2017-06-27 DIAGNOSIS — D631 Anemia in chronic kidney disease: Secondary | ICD-10-CM | POA: Diagnosis not present

## 2017-06-29 DIAGNOSIS — N2581 Secondary hyperparathyroidism of renal origin: Secondary | ICD-10-CM | POA: Diagnosis not present

## 2017-06-29 DIAGNOSIS — D631 Anemia in chronic kidney disease: Secondary | ICD-10-CM | POA: Diagnosis not present

## 2017-06-29 DIAGNOSIS — N186 End stage renal disease: Secondary | ICD-10-CM | POA: Diagnosis not present

## 2017-07-04 DIAGNOSIS — N2581 Secondary hyperparathyroidism of renal origin: Secondary | ICD-10-CM | POA: Diagnosis not present

## 2017-07-04 DIAGNOSIS — N186 End stage renal disease: Secondary | ICD-10-CM | POA: Diagnosis not present

## 2017-07-04 DIAGNOSIS — D631 Anemia in chronic kidney disease: Secondary | ICD-10-CM | POA: Diagnosis not present

## 2017-07-06 DIAGNOSIS — D631 Anemia in chronic kidney disease: Secondary | ICD-10-CM | POA: Diagnosis not present

## 2017-07-06 DIAGNOSIS — N2581 Secondary hyperparathyroidism of renal origin: Secondary | ICD-10-CM | POA: Diagnosis not present

## 2017-07-06 DIAGNOSIS — N186 End stage renal disease: Secondary | ICD-10-CM | POA: Diagnosis not present

## 2017-07-08 DIAGNOSIS — D631 Anemia in chronic kidney disease: Secondary | ICD-10-CM | POA: Diagnosis not present

## 2017-07-08 DIAGNOSIS — N186 End stage renal disease: Secondary | ICD-10-CM | POA: Diagnosis not present

## 2017-07-08 DIAGNOSIS — N2581 Secondary hyperparathyroidism of renal origin: Secondary | ICD-10-CM | POA: Diagnosis not present

## 2017-07-10 DIAGNOSIS — D631 Anemia in chronic kidney disease: Secondary | ICD-10-CM | POA: Diagnosis not present

## 2017-07-10 DIAGNOSIS — N2581 Secondary hyperparathyroidism of renal origin: Secondary | ICD-10-CM | POA: Diagnosis not present

## 2017-07-10 DIAGNOSIS — N186 End stage renal disease: Secondary | ICD-10-CM | POA: Diagnosis not present

## 2017-07-13 DIAGNOSIS — D631 Anemia in chronic kidney disease: Secondary | ICD-10-CM | POA: Diagnosis not present

## 2017-07-13 DIAGNOSIS — N2581 Secondary hyperparathyroidism of renal origin: Secondary | ICD-10-CM | POA: Diagnosis not present

## 2017-07-13 DIAGNOSIS — N186 End stage renal disease: Secondary | ICD-10-CM | POA: Diagnosis not present

## 2017-07-17 DIAGNOSIS — D631 Anemia in chronic kidney disease: Secondary | ICD-10-CM | POA: Diagnosis not present

## 2017-07-17 DIAGNOSIS — Z992 Dependence on renal dialysis: Secondary | ICD-10-CM | POA: Diagnosis not present

## 2017-07-17 DIAGNOSIS — N2581 Secondary hyperparathyroidism of renal origin: Secondary | ICD-10-CM | POA: Diagnosis not present

## 2017-07-17 DIAGNOSIS — N049 Nephrotic syndrome with unspecified morphologic changes: Secondary | ICD-10-CM | POA: Diagnosis not present

## 2017-07-17 DIAGNOSIS — N186 End stage renal disease: Secondary | ICD-10-CM | POA: Diagnosis not present

## 2017-07-20 DIAGNOSIS — D631 Anemia in chronic kidney disease: Secondary | ICD-10-CM | POA: Diagnosis not present

## 2017-07-20 DIAGNOSIS — N2581 Secondary hyperparathyroidism of renal origin: Secondary | ICD-10-CM | POA: Diagnosis not present

## 2017-07-20 DIAGNOSIS — N186 End stage renal disease: Secondary | ICD-10-CM | POA: Diagnosis not present

## 2017-07-22 DIAGNOSIS — N186 End stage renal disease: Secondary | ICD-10-CM | POA: Diagnosis not present

## 2017-07-22 DIAGNOSIS — N2581 Secondary hyperparathyroidism of renal origin: Secondary | ICD-10-CM | POA: Diagnosis not present

## 2017-07-22 DIAGNOSIS — D631 Anemia in chronic kidney disease: Secondary | ICD-10-CM | POA: Diagnosis not present

## 2017-07-25 DIAGNOSIS — N2581 Secondary hyperparathyroidism of renal origin: Secondary | ICD-10-CM | POA: Diagnosis not present

## 2017-07-25 DIAGNOSIS — N186 End stage renal disease: Secondary | ICD-10-CM | POA: Diagnosis not present

## 2017-07-25 DIAGNOSIS — D631 Anemia in chronic kidney disease: Secondary | ICD-10-CM | POA: Diagnosis not present

## 2017-07-27 DIAGNOSIS — D631 Anemia in chronic kidney disease: Secondary | ICD-10-CM | POA: Diagnosis not present

## 2017-07-27 DIAGNOSIS — N2581 Secondary hyperparathyroidism of renal origin: Secondary | ICD-10-CM | POA: Diagnosis not present

## 2017-07-27 DIAGNOSIS — N186 End stage renal disease: Secondary | ICD-10-CM | POA: Diagnosis not present

## 2017-07-29 DIAGNOSIS — N186 End stage renal disease: Secondary | ICD-10-CM | POA: Diagnosis not present

## 2017-07-29 DIAGNOSIS — D631 Anemia in chronic kidney disease: Secondary | ICD-10-CM | POA: Diagnosis not present

## 2017-07-29 DIAGNOSIS — N2581 Secondary hyperparathyroidism of renal origin: Secondary | ICD-10-CM | POA: Diagnosis not present

## 2017-08-01 DIAGNOSIS — N2581 Secondary hyperparathyroidism of renal origin: Secondary | ICD-10-CM | POA: Diagnosis not present

## 2017-08-01 DIAGNOSIS — D631 Anemia in chronic kidney disease: Secondary | ICD-10-CM | POA: Diagnosis not present

## 2017-08-01 DIAGNOSIS — N186 End stage renal disease: Secondary | ICD-10-CM | POA: Diagnosis not present

## 2017-08-03 DIAGNOSIS — D631 Anemia in chronic kidney disease: Secondary | ICD-10-CM | POA: Diagnosis not present

## 2017-08-03 DIAGNOSIS — N2581 Secondary hyperparathyroidism of renal origin: Secondary | ICD-10-CM | POA: Diagnosis not present

## 2017-08-03 DIAGNOSIS — N186 End stage renal disease: Secondary | ICD-10-CM | POA: Diagnosis not present

## 2017-08-05 DIAGNOSIS — N2581 Secondary hyperparathyroidism of renal origin: Secondary | ICD-10-CM | POA: Diagnosis not present

## 2017-08-05 DIAGNOSIS — N186 End stage renal disease: Secondary | ICD-10-CM | POA: Diagnosis not present

## 2017-08-05 DIAGNOSIS — D631 Anemia in chronic kidney disease: Secondary | ICD-10-CM | POA: Diagnosis not present

## 2017-08-08 DIAGNOSIS — N2581 Secondary hyperparathyroidism of renal origin: Secondary | ICD-10-CM | POA: Diagnosis not present

## 2017-08-08 DIAGNOSIS — D631 Anemia in chronic kidney disease: Secondary | ICD-10-CM | POA: Diagnosis not present

## 2017-08-08 DIAGNOSIS — N186 End stage renal disease: Secondary | ICD-10-CM | POA: Diagnosis not present

## 2017-08-10 DIAGNOSIS — N186 End stage renal disease: Secondary | ICD-10-CM | POA: Diagnosis not present

## 2017-08-10 DIAGNOSIS — N2581 Secondary hyperparathyroidism of renal origin: Secondary | ICD-10-CM | POA: Diagnosis not present

## 2017-08-10 DIAGNOSIS — D631 Anemia in chronic kidney disease: Secondary | ICD-10-CM | POA: Diagnosis not present

## 2017-08-12 DIAGNOSIS — N2581 Secondary hyperparathyroidism of renal origin: Secondary | ICD-10-CM | POA: Diagnosis not present

## 2017-08-12 DIAGNOSIS — N186 End stage renal disease: Secondary | ICD-10-CM | POA: Diagnosis not present

## 2017-08-12 DIAGNOSIS — D631 Anemia in chronic kidney disease: Secondary | ICD-10-CM | POA: Diagnosis not present

## 2017-08-15 DIAGNOSIS — N186 End stage renal disease: Secondary | ICD-10-CM | POA: Diagnosis not present

## 2017-08-15 DIAGNOSIS — N2581 Secondary hyperparathyroidism of renal origin: Secondary | ICD-10-CM | POA: Diagnosis not present

## 2017-08-15 DIAGNOSIS — D631 Anemia in chronic kidney disease: Secondary | ICD-10-CM | POA: Diagnosis not present

## 2017-08-17 DIAGNOSIS — N049 Nephrotic syndrome with unspecified morphologic changes: Secondary | ICD-10-CM | POA: Diagnosis not present

## 2017-08-17 DIAGNOSIS — N186 End stage renal disease: Secondary | ICD-10-CM | POA: Diagnosis not present

## 2017-08-17 DIAGNOSIS — D631 Anemia in chronic kidney disease: Secondary | ICD-10-CM | POA: Diagnosis not present

## 2017-08-17 DIAGNOSIS — N2581 Secondary hyperparathyroidism of renal origin: Secondary | ICD-10-CM | POA: Diagnosis not present

## 2017-08-17 DIAGNOSIS — Z992 Dependence on renal dialysis: Secondary | ICD-10-CM | POA: Diagnosis not present

## 2017-08-18 DIAGNOSIS — N186 End stage renal disease: Secondary | ICD-10-CM | POA: Diagnosis not present

## 2017-08-18 DIAGNOSIS — N049 Nephrotic syndrome with unspecified morphologic changes: Secondary | ICD-10-CM | POA: Diagnosis not present

## 2017-08-18 DIAGNOSIS — Z992 Dependence on renal dialysis: Secondary | ICD-10-CM | POA: Diagnosis not present

## 2017-08-19 DIAGNOSIS — N2581 Secondary hyperparathyroidism of renal origin: Secondary | ICD-10-CM | POA: Diagnosis not present

## 2017-08-19 DIAGNOSIS — N186 End stage renal disease: Secondary | ICD-10-CM | POA: Diagnosis not present

## 2017-08-19 DIAGNOSIS — D631 Anemia in chronic kidney disease: Secondary | ICD-10-CM | POA: Diagnosis not present

## 2017-08-22 DIAGNOSIS — N2581 Secondary hyperparathyroidism of renal origin: Secondary | ICD-10-CM | POA: Diagnosis not present

## 2017-08-22 DIAGNOSIS — D631 Anemia in chronic kidney disease: Secondary | ICD-10-CM | POA: Diagnosis not present

## 2017-08-22 DIAGNOSIS — N186 End stage renal disease: Secondary | ICD-10-CM | POA: Diagnosis not present

## 2017-08-24 DIAGNOSIS — N2581 Secondary hyperparathyroidism of renal origin: Secondary | ICD-10-CM | POA: Diagnosis not present

## 2017-08-24 DIAGNOSIS — N186 End stage renal disease: Secondary | ICD-10-CM | POA: Diagnosis not present

## 2017-08-24 DIAGNOSIS — D631 Anemia in chronic kidney disease: Secondary | ICD-10-CM | POA: Diagnosis not present

## 2017-08-29 DIAGNOSIS — N186 End stage renal disease: Secondary | ICD-10-CM | POA: Diagnosis not present

## 2017-08-29 DIAGNOSIS — N2581 Secondary hyperparathyroidism of renal origin: Secondary | ICD-10-CM | POA: Diagnosis not present

## 2017-08-29 DIAGNOSIS — D631 Anemia in chronic kidney disease: Secondary | ICD-10-CM | POA: Diagnosis not present

## 2017-08-31 DIAGNOSIS — D631 Anemia in chronic kidney disease: Secondary | ICD-10-CM | POA: Diagnosis not present

## 2017-08-31 DIAGNOSIS — N186 End stage renal disease: Secondary | ICD-10-CM | POA: Diagnosis not present

## 2017-08-31 DIAGNOSIS — N2581 Secondary hyperparathyroidism of renal origin: Secondary | ICD-10-CM | POA: Diagnosis not present

## 2017-09-02 DIAGNOSIS — N186 End stage renal disease: Secondary | ICD-10-CM | POA: Diagnosis not present

## 2017-09-02 DIAGNOSIS — D631 Anemia in chronic kidney disease: Secondary | ICD-10-CM | POA: Diagnosis not present

## 2017-09-02 DIAGNOSIS — N2581 Secondary hyperparathyroidism of renal origin: Secondary | ICD-10-CM | POA: Diagnosis not present

## 2017-09-05 DIAGNOSIS — N2581 Secondary hyperparathyroidism of renal origin: Secondary | ICD-10-CM | POA: Diagnosis not present

## 2017-09-05 DIAGNOSIS — N186 End stage renal disease: Secondary | ICD-10-CM | POA: Diagnosis not present

## 2017-09-05 DIAGNOSIS — D631 Anemia in chronic kidney disease: Secondary | ICD-10-CM | POA: Diagnosis not present

## 2017-09-07 DIAGNOSIS — D631 Anemia in chronic kidney disease: Secondary | ICD-10-CM | POA: Diagnosis not present

## 2017-09-07 DIAGNOSIS — N186 End stage renal disease: Secondary | ICD-10-CM | POA: Diagnosis not present

## 2017-09-07 DIAGNOSIS — N2581 Secondary hyperparathyroidism of renal origin: Secondary | ICD-10-CM | POA: Diagnosis not present

## 2017-09-09 DIAGNOSIS — D631 Anemia in chronic kidney disease: Secondary | ICD-10-CM | POA: Diagnosis not present

## 2017-09-09 DIAGNOSIS — N186 End stage renal disease: Secondary | ICD-10-CM | POA: Diagnosis not present

## 2017-09-09 DIAGNOSIS — N2581 Secondary hyperparathyroidism of renal origin: Secondary | ICD-10-CM | POA: Diagnosis not present

## 2017-09-14 DIAGNOSIS — D631 Anemia in chronic kidney disease: Secondary | ICD-10-CM | POA: Diagnosis not present

## 2017-09-14 DIAGNOSIS — N2581 Secondary hyperparathyroidism of renal origin: Secondary | ICD-10-CM | POA: Diagnosis not present

## 2017-09-14 DIAGNOSIS — N186 End stage renal disease: Secondary | ICD-10-CM | POA: Diagnosis not present

## 2017-09-15 DIAGNOSIS — N049 Nephrotic syndrome with unspecified morphologic changes: Secondary | ICD-10-CM | POA: Diagnosis not present

## 2017-09-15 DIAGNOSIS — N186 End stage renal disease: Secondary | ICD-10-CM | POA: Diagnosis not present

## 2017-09-15 DIAGNOSIS — Z992 Dependence on renal dialysis: Secondary | ICD-10-CM | POA: Diagnosis not present

## 2017-09-16 DIAGNOSIS — D631 Anemia in chronic kidney disease: Secondary | ICD-10-CM | POA: Diagnosis not present

## 2017-09-16 DIAGNOSIS — N186 End stage renal disease: Secondary | ICD-10-CM | POA: Diagnosis not present

## 2017-09-16 DIAGNOSIS — N2581 Secondary hyperparathyroidism of renal origin: Secondary | ICD-10-CM | POA: Diagnosis not present

## 2017-09-19 DIAGNOSIS — N186 End stage renal disease: Secondary | ICD-10-CM | POA: Diagnosis not present

## 2017-09-19 DIAGNOSIS — N2581 Secondary hyperparathyroidism of renal origin: Secondary | ICD-10-CM | POA: Diagnosis not present

## 2017-09-19 DIAGNOSIS — D631 Anemia in chronic kidney disease: Secondary | ICD-10-CM | POA: Diagnosis not present

## 2017-09-21 DIAGNOSIS — N2581 Secondary hyperparathyroidism of renal origin: Secondary | ICD-10-CM | POA: Diagnosis not present

## 2017-09-21 DIAGNOSIS — D631 Anemia in chronic kidney disease: Secondary | ICD-10-CM | POA: Diagnosis not present

## 2017-09-21 DIAGNOSIS — N186 End stage renal disease: Secondary | ICD-10-CM | POA: Diagnosis not present

## 2017-09-23 DIAGNOSIS — N2581 Secondary hyperparathyroidism of renal origin: Secondary | ICD-10-CM | POA: Diagnosis not present

## 2017-09-23 DIAGNOSIS — N186 End stage renal disease: Secondary | ICD-10-CM | POA: Diagnosis not present

## 2017-09-23 DIAGNOSIS — D631 Anemia in chronic kidney disease: Secondary | ICD-10-CM | POA: Diagnosis not present

## 2017-09-26 DIAGNOSIS — D631 Anemia in chronic kidney disease: Secondary | ICD-10-CM | POA: Diagnosis not present

## 2017-09-26 DIAGNOSIS — N2581 Secondary hyperparathyroidism of renal origin: Secondary | ICD-10-CM | POA: Diagnosis not present

## 2017-09-26 DIAGNOSIS — N186 End stage renal disease: Secondary | ICD-10-CM | POA: Diagnosis not present

## 2017-09-28 DIAGNOSIS — D631 Anemia in chronic kidney disease: Secondary | ICD-10-CM | POA: Diagnosis not present

## 2017-09-28 DIAGNOSIS — N2581 Secondary hyperparathyroidism of renal origin: Secondary | ICD-10-CM | POA: Diagnosis not present

## 2017-09-28 DIAGNOSIS — N186 End stage renal disease: Secondary | ICD-10-CM | POA: Diagnosis not present

## 2017-09-30 DIAGNOSIS — N2581 Secondary hyperparathyroidism of renal origin: Secondary | ICD-10-CM | POA: Diagnosis not present

## 2017-09-30 DIAGNOSIS — D631 Anemia in chronic kidney disease: Secondary | ICD-10-CM | POA: Diagnosis not present

## 2017-09-30 DIAGNOSIS — N186 End stage renal disease: Secondary | ICD-10-CM | POA: Diagnosis not present

## 2017-10-03 DIAGNOSIS — N2581 Secondary hyperparathyroidism of renal origin: Secondary | ICD-10-CM | POA: Diagnosis not present

## 2017-10-03 DIAGNOSIS — D631 Anemia in chronic kidney disease: Secondary | ICD-10-CM | POA: Diagnosis not present

## 2017-10-03 DIAGNOSIS — N186 End stage renal disease: Secondary | ICD-10-CM | POA: Diagnosis not present

## 2017-10-05 DIAGNOSIS — D631 Anemia in chronic kidney disease: Secondary | ICD-10-CM | POA: Diagnosis not present

## 2017-10-05 DIAGNOSIS — N2581 Secondary hyperparathyroidism of renal origin: Secondary | ICD-10-CM | POA: Diagnosis not present

## 2017-10-05 DIAGNOSIS — N186 End stage renal disease: Secondary | ICD-10-CM | POA: Diagnosis not present

## 2017-10-07 DIAGNOSIS — D631 Anemia in chronic kidney disease: Secondary | ICD-10-CM | POA: Diagnosis not present

## 2017-10-07 DIAGNOSIS — N2581 Secondary hyperparathyroidism of renal origin: Secondary | ICD-10-CM | POA: Diagnosis not present

## 2017-10-07 DIAGNOSIS — N186 End stage renal disease: Secondary | ICD-10-CM | POA: Diagnosis not present

## 2017-10-10 DIAGNOSIS — N2581 Secondary hyperparathyroidism of renal origin: Secondary | ICD-10-CM | POA: Diagnosis not present

## 2017-10-10 DIAGNOSIS — D631 Anemia in chronic kidney disease: Secondary | ICD-10-CM | POA: Diagnosis not present

## 2017-10-10 DIAGNOSIS — N186 End stage renal disease: Secondary | ICD-10-CM | POA: Diagnosis not present

## 2017-10-12 DIAGNOSIS — D631 Anemia in chronic kidney disease: Secondary | ICD-10-CM | POA: Diagnosis not present

## 2017-10-12 DIAGNOSIS — N186 End stage renal disease: Secondary | ICD-10-CM | POA: Diagnosis not present

## 2017-10-12 DIAGNOSIS — N2581 Secondary hyperparathyroidism of renal origin: Secondary | ICD-10-CM | POA: Diagnosis not present

## 2017-10-14 DIAGNOSIS — N2581 Secondary hyperparathyroidism of renal origin: Secondary | ICD-10-CM | POA: Diagnosis not present

## 2017-10-14 DIAGNOSIS — D631 Anemia in chronic kidney disease: Secondary | ICD-10-CM | POA: Diagnosis not present

## 2017-10-14 DIAGNOSIS — N186 End stage renal disease: Secondary | ICD-10-CM | POA: Diagnosis not present

## 2017-10-16 DIAGNOSIS — N186 End stage renal disease: Secondary | ICD-10-CM | POA: Diagnosis not present

## 2017-10-16 DIAGNOSIS — Z992 Dependence on renal dialysis: Secondary | ICD-10-CM | POA: Diagnosis not present

## 2017-10-16 DIAGNOSIS — N049 Nephrotic syndrome with unspecified morphologic changes: Secondary | ICD-10-CM | POA: Diagnosis not present

## 2017-10-17 DIAGNOSIS — D631 Anemia in chronic kidney disease: Secondary | ICD-10-CM | POA: Diagnosis not present

## 2017-10-17 DIAGNOSIS — N2581 Secondary hyperparathyroidism of renal origin: Secondary | ICD-10-CM | POA: Diagnosis not present

## 2017-10-17 DIAGNOSIS — N186 End stage renal disease: Secondary | ICD-10-CM | POA: Diagnosis not present

## 2017-10-19 DIAGNOSIS — D631 Anemia in chronic kidney disease: Secondary | ICD-10-CM | POA: Diagnosis not present

## 2017-10-19 DIAGNOSIS — N186 End stage renal disease: Secondary | ICD-10-CM | POA: Diagnosis not present

## 2017-10-19 DIAGNOSIS — N2581 Secondary hyperparathyroidism of renal origin: Secondary | ICD-10-CM | POA: Diagnosis not present

## 2017-10-21 DIAGNOSIS — N2581 Secondary hyperparathyroidism of renal origin: Secondary | ICD-10-CM | POA: Diagnosis not present

## 2017-10-21 DIAGNOSIS — D631 Anemia in chronic kidney disease: Secondary | ICD-10-CM | POA: Diagnosis not present

## 2017-10-21 DIAGNOSIS — N186 End stage renal disease: Secondary | ICD-10-CM | POA: Diagnosis not present

## 2017-10-24 DIAGNOSIS — D631 Anemia in chronic kidney disease: Secondary | ICD-10-CM | POA: Diagnosis not present

## 2017-10-24 DIAGNOSIS — N186 End stage renal disease: Secondary | ICD-10-CM | POA: Diagnosis not present

## 2017-10-24 DIAGNOSIS — N2581 Secondary hyperparathyroidism of renal origin: Secondary | ICD-10-CM | POA: Diagnosis not present

## 2017-10-26 DIAGNOSIS — N2581 Secondary hyperparathyroidism of renal origin: Secondary | ICD-10-CM | POA: Diagnosis not present

## 2017-10-26 DIAGNOSIS — N186 End stage renal disease: Secondary | ICD-10-CM | POA: Diagnosis not present

## 2017-10-26 DIAGNOSIS — D631 Anemia in chronic kidney disease: Secondary | ICD-10-CM | POA: Diagnosis not present

## 2017-10-28 DIAGNOSIS — N186 End stage renal disease: Secondary | ICD-10-CM | POA: Diagnosis not present

## 2017-10-28 DIAGNOSIS — N2581 Secondary hyperparathyroidism of renal origin: Secondary | ICD-10-CM | POA: Diagnosis not present

## 2017-10-28 DIAGNOSIS — D631 Anemia in chronic kidney disease: Secondary | ICD-10-CM | POA: Diagnosis not present

## 2017-10-31 DIAGNOSIS — D631 Anemia in chronic kidney disease: Secondary | ICD-10-CM | POA: Diagnosis not present

## 2017-10-31 DIAGNOSIS — N2581 Secondary hyperparathyroidism of renal origin: Secondary | ICD-10-CM | POA: Diagnosis not present

## 2017-10-31 DIAGNOSIS — N186 End stage renal disease: Secondary | ICD-10-CM | POA: Diagnosis not present

## 2017-11-02 DIAGNOSIS — N2581 Secondary hyperparathyroidism of renal origin: Secondary | ICD-10-CM | POA: Diagnosis not present

## 2017-11-02 DIAGNOSIS — D631 Anemia in chronic kidney disease: Secondary | ICD-10-CM | POA: Diagnosis not present

## 2017-11-02 DIAGNOSIS — N186 End stage renal disease: Secondary | ICD-10-CM | POA: Diagnosis not present

## 2017-11-04 DIAGNOSIS — N186 End stage renal disease: Secondary | ICD-10-CM | POA: Diagnosis not present

## 2017-11-04 DIAGNOSIS — D631 Anemia in chronic kidney disease: Secondary | ICD-10-CM | POA: Diagnosis not present

## 2017-11-04 DIAGNOSIS — N2581 Secondary hyperparathyroidism of renal origin: Secondary | ICD-10-CM | POA: Diagnosis not present

## 2017-11-07 DIAGNOSIS — D631 Anemia in chronic kidney disease: Secondary | ICD-10-CM | POA: Diagnosis not present

## 2017-11-07 DIAGNOSIS — N2581 Secondary hyperparathyroidism of renal origin: Secondary | ICD-10-CM | POA: Diagnosis not present

## 2017-11-07 DIAGNOSIS — N186 End stage renal disease: Secondary | ICD-10-CM | POA: Diagnosis not present

## 2017-11-09 DIAGNOSIS — N186 End stage renal disease: Secondary | ICD-10-CM | POA: Diagnosis not present

## 2017-11-09 DIAGNOSIS — D631 Anemia in chronic kidney disease: Secondary | ICD-10-CM | POA: Diagnosis not present

## 2017-11-09 DIAGNOSIS — N2581 Secondary hyperparathyroidism of renal origin: Secondary | ICD-10-CM | POA: Diagnosis not present

## 2017-11-11 DIAGNOSIS — D631 Anemia in chronic kidney disease: Secondary | ICD-10-CM | POA: Diagnosis not present

## 2017-11-11 DIAGNOSIS — N186 End stage renal disease: Secondary | ICD-10-CM | POA: Diagnosis not present

## 2017-11-11 DIAGNOSIS — N2581 Secondary hyperparathyroidism of renal origin: Secondary | ICD-10-CM | POA: Diagnosis not present

## 2017-11-14 DIAGNOSIS — N186 End stage renal disease: Secondary | ICD-10-CM | POA: Diagnosis not present

## 2017-11-14 DIAGNOSIS — D631 Anemia in chronic kidney disease: Secondary | ICD-10-CM | POA: Diagnosis not present

## 2017-11-14 DIAGNOSIS — N2581 Secondary hyperparathyroidism of renal origin: Secondary | ICD-10-CM | POA: Diagnosis not present

## 2017-11-15 DIAGNOSIS — Z992 Dependence on renal dialysis: Secondary | ICD-10-CM | POA: Diagnosis not present

## 2017-11-15 DIAGNOSIS — N186 End stage renal disease: Secondary | ICD-10-CM | POA: Diagnosis not present

## 2017-11-15 DIAGNOSIS — N049 Nephrotic syndrome with unspecified morphologic changes: Secondary | ICD-10-CM | POA: Diagnosis not present

## 2017-11-16 DIAGNOSIS — N186 End stage renal disease: Secondary | ICD-10-CM | POA: Diagnosis not present

## 2017-11-16 DIAGNOSIS — N2581 Secondary hyperparathyroidism of renal origin: Secondary | ICD-10-CM | POA: Diagnosis not present

## 2017-11-16 DIAGNOSIS — D631 Anemia in chronic kidney disease: Secondary | ICD-10-CM | POA: Diagnosis not present

## 2017-11-18 DIAGNOSIS — N2581 Secondary hyperparathyroidism of renal origin: Secondary | ICD-10-CM | POA: Diagnosis not present

## 2017-11-18 DIAGNOSIS — N186 End stage renal disease: Secondary | ICD-10-CM | POA: Diagnosis not present

## 2017-11-18 DIAGNOSIS — D631 Anemia in chronic kidney disease: Secondary | ICD-10-CM | POA: Diagnosis not present

## 2017-11-21 DIAGNOSIS — D631 Anemia in chronic kidney disease: Secondary | ICD-10-CM | POA: Diagnosis not present

## 2017-11-21 DIAGNOSIS — N2581 Secondary hyperparathyroidism of renal origin: Secondary | ICD-10-CM | POA: Diagnosis not present

## 2017-11-21 DIAGNOSIS — N186 End stage renal disease: Secondary | ICD-10-CM | POA: Diagnosis not present

## 2017-11-23 DIAGNOSIS — D631 Anemia in chronic kidney disease: Secondary | ICD-10-CM | POA: Diagnosis not present

## 2017-11-23 DIAGNOSIS — N2581 Secondary hyperparathyroidism of renal origin: Secondary | ICD-10-CM | POA: Diagnosis not present

## 2017-11-23 DIAGNOSIS — N186 End stage renal disease: Secondary | ICD-10-CM | POA: Diagnosis not present

## 2017-11-25 DIAGNOSIS — N186 End stage renal disease: Secondary | ICD-10-CM | POA: Diagnosis not present

## 2017-11-25 DIAGNOSIS — N2581 Secondary hyperparathyroidism of renal origin: Secondary | ICD-10-CM | POA: Diagnosis not present

## 2017-11-25 DIAGNOSIS — D631 Anemia in chronic kidney disease: Secondary | ICD-10-CM | POA: Diagnosis not present

## 2017-11-28 DIAGNOSIS — N2581 Secondary hyperparathyroidism of renal origin: Secondary | ICD-10-CM | POA: Diagnosis not present

## 2017-11-28 DIAGNOSIS — N186 End stage renal disease: Secondary | ICD-10-CM | POA: Diagnosis not present

## 2017-11-28 DIAGNOSIS — D631 Anemia in chronic kidney disease: Secondary | ICD-10-CM | POA: Diagnosis not present

## 2017-11-30 DIAGNOSIS — N186 End stage renal disease: Secondary | ICD-10-CM | POA: Diagnosis not present

## 2017-11-30 DIAGNOSIS — D631 Anemia in chronic kidney disease: Secondary | ICD-10-CM | POA: Diagnosis not present

## 2017-11-30 DIAGNOSIS — N2581 Secondary hyperparathyroidism of renal origin: Secondary | ICD-10-CM | POA: Diagnosis not present

## 2017-12-05 DIAGNOSIS — N186 End stage renal disease: Secondary | ICD-10-CM | POA: Diagnosis not present

## 2017-12-05 DIAGNOSIS — N2581 Secondary hyperparathyroidism of renal origin: Secondary | ICD-10-CM | POA: Diagnosis not present

## 2017-12-05 DIAGNOSIS — D631 Anemia in chronic kidney disease: Secondary | ICD-10-CM | POA: Diagnosis not present

## 2017-12-07 DIAGNOSIS — N2581 Secondary hyperparathyroidism of renal origin: Secondary | ICD-10-CM | POA: Diagnosis not present

## 2017-12-07 DIAGNOSIS — D631 Anemia in chronic kidney disease: Secondary | ICD-10-CM | POA: Diagnosis not present

## 2017-12-07 DIAGNOSIS — N186 End stage renal disease: Secondary | ICD-10-CM | POA: Diagnosis not present

## 2017-12-09 DIAGNOSIS — N186 End stage renal disease: Secondary | ICD-10-CM | POA: Diagnosis not present

## 2017-12-09 DIAGNOSIS — N2581 Secondary hyperparathyroidism of renal origin: Secondary | ICD-10-CM | POA: Diagnosis not present

## 2017-12-09 DIAGNOSIS — D631 Anemia in chronic kidney disease: Secondary | ICD-10-CM | POA: Diagnosis not present

## 2017-12-12 DIAGNOSIS — D631 Anemia in chronic kidney disease: Secondary | ICD-10-CM | POA: Diagnosis not present

## 2017-12-12 DIAGNOSIS — N186 End stage renal disease: Secondary | ICD-10-CM | POA: Diagnosis not present

## 2017-12-12 DIAGNOSIS — N2581 Secondary hyperparathyroidism of renal origin: Secondary | ICD-10-CM | POA: Diagnosis not present

## 2017-12-14 DIAGNOSIS — D631 Anemia in chronic kidney disease: Secondary | ICD-10-CM | POA: Diagnosis not present

## 2017-12-14 DIAGNOSIS — N2581 Secondary hyperparathyroidism of renal origin: Secondary | ICD-10-CM | POA: Diagnosis not present

## 2017-12-14 DIAGNOSIS — N186 End stage renal disease: Secondary | ICD-10-CM | POA: Diagnosis not present

## 2017-12-16 DIAGNOSIS — N2581 Secondary hyperparathyroidism of renal origin: Secondary | ICD-10-CM | POA: Diagnosis not present

## 2017-12-16 DIAGNOSIS — N049 Nephrotic syndrome with unspecified morphologic changes: Secondary | ICD-10-CM | POA: Diagnosis not present

## 2017-12-16 DIAGNOSIS — D631 Anemia in chronic kidney disease: Secondary | ICD-10-CM | POA: Diagnosis not present

## 2017-12-16 DIAGNOSIS — N186 End stage renal disease: Secondary | ICD-10-CM | POA: Diagnosis not present

## 2017-12-16 DIAGNOSIS — Z992 Dependence on renal dialysis: Secondary | ICD-10-CM | POA: Diagnosis not present

## 2017-12-19 DIAGNOSIS — N2581 Secondary hyperparathyroidism of renal origin: Secondary | ICD-10-CM | POA: Diagnosis not present

## 2017-12-19 DIAGNOSIS — D631 Anemia in chronic kidney disease: Secondary | ICD-10-CM | POA: Diagnosis not present

## 2017-12-19 DIAGNOSIS — N186 End stage renal disease: Secondary | ICD-10-CM | POA: Diagnosis not present

## 2017-12-21 DIAGNOSIS — N186 End stage renal disease: Secondary | ICD-10-CM | POA: Diagnosis not present

## 2017-12-21 DIAGNOSIS — D631 Anemia in chronic kidney disease: Secondary | ICD-10-CM | POA: Diagnosis not present

## 2017-12-21 DIAGNOSIS — N2581 Secondary hyperparathyroidism of renal origin: Secondary | ICD-10-CM | POA: Diagnosis not present

## 2017-12-23 DIAGNOSIS — D631 Anemia in chronic kidney disease: Secondary | ICD-10-CM | POA: Diagnosis not present

## 2017-12-23 DIAGNOSIS — N2581 Secondary hyperparathyroidism of renal origin: Secondary | ICD-10-CM | POA: Diagnosis not present

## 2017-12-23 DIAGNOSIS — N186 End stage renal disease: Secondary | ICD-10-CM | POA: Diagnosis not present

## 2017-12-25 DIAGNOSIS — S76311A Strain of muscle, fascia and tendon of the posterior muscle group at thigh level, right thigh, initial encounter: Secondary | ICD-10-CM | POA: Diagnosis not present

## 2017-12-26 DIAGNOSIS — N186 End stage renal disease: Secondary | ICD-10-CM | POA: Diagnosis not present

## 2017-12-26 DIAGNOSIS — N2581 Secondary hyperparathyroidism of renal origin: Secondary | ICD-10-CM | POA: Diagnosis not present

## 2017-12-26 DIAGNOSIS — D631 Anemia in chronic kidney disease: Secondary | ICD-10-CM | POA: Diagnosis not present

## 2017-12-28 DIAGNOSIS — D631 Anemia in chronic kidney disease: Secondary | ICD-10-CM | POA: Diagnosis not present

## 2017-12-28 DIAGNOSIS — N2581 Secondary hyperparathyroidism of renal origin: Secondary | ICD-10-CM | POA: Diagnosis not present

## 2017-12-28 DIAGNOSIS — N186 End stage renal disease: Secondary | ICD-10-CM | POA: Diagnosis not present

## 2017-12-30 DIAGNOSIS — N186 End stage renal disease: Secondary | ICD-10-CM | POA: Diagnosis not present

## 2017-12-30 DIAGNOSIS — N2581 Secondary hyperparathyroidism of renal origin: Secondary | ICD-10-CM | POA: Diagnosis not present

## 2017-12-30 DIAGNOSIS — D631 Anemia in chronic kidney disease: Secondary | ICD-10-CM | POA: Diagnosis not present

## 2018-01-02 DIAGNOSIS — N2581 Secondary hyperparathyroidism of renal origin: Secondary | ICD-10-CM | POA: Diagnosis not present

## 2018-01-02 DIAGNOSIS — D631 Anemia in chronic kidney disease: Secondary | ICD-10-CM | POA: Diagnosis not present

## 2018-01-02 DIAGNOSIS — N186 End stage renal disease: Secondary | ICD-10-CM | POA: Diagnosis not present

## 2018-01-04 DIAGNOSIS — N2581 Secondary hyperparathyroidism of renal origin: Secondary | ICD-10-CM | POA: Diagnosis not present

## 2018-01-04 DIAGNOSIS — D631 Anemia in chronic kidney disease: Secondary | ICD-10-CM | POA: Diagnosis not present

## 2018-01-04 DIAGNOSIS — N186 End stage renal disease: Secondary | ICD-10-CM | POA: Diagnosis not present

## 2018-01-06 DIAGNOSIS — D631 Anemia in chronic kidney disease: Secondary | ICD-10-CM | POA: Diagnosis not present

## 2018-01-06 DIAGNOSIS — N2581 Secondary hyperparathyroidism of renal origin: Secondary | ICD-10-CM | POA: Diagnosis not present

## 2018-01-06 DIAGNOSIS — N186 End stage renal disease: Secondary | ICD-10-CM | POA: Diagnosis not present

## 2018-01-09 DIAGNOSIS — N186 End stage renal disease: Secondary | ICD-10-CM | POA: Diagnosis not present

## 2018-01-09 DIAGNOSIS — D631 Anemia in chronic kidney disease: Secondary | ICD-10-CM | POA: Diagnosis not present

## 2018-01-09 DIAGNOSIS — N2581 Secondary hyperparathyroidism of renal origin: Secondary | ICD-10-CM | POA: Diagnosis not present

## 2018-01-11 DIAGNOSIS — N2581 Secondary hyperparathyroidism of renal origin: Secondary | ICD-10-CM | POA: Diagnosis not present

## 2018-01-11 DIAGNOSIS — N186 End stage renal disease: Secondary | ICD-10-CM | POA: Diagnosis not present

## 2018-01-11 DIAGNOSIS — D631 Anemia in chronic kidney disease: Secondary | ICD-10-CM | POA: Diagnosis not present

## 2018-01-13 DIAGNOSIS — N186 End stage renal disease: Secondary | ICD-10-CM | POA: Diagnosis not present

## 2018-01-13 DIAGNOSIS — D631 Anemia in chronic kidney disease: Secondary | ICD-10-CM | POA: Diagnosis not present

## 2018-01-13 DIAGNOSIS — N2581 Secondary hyperparathyroidism of renal origin: Secondary | ICD-10-CM | POA: Diagnosis not present

## 2018-01-15 DIAGNOSIS — N049 Nephrotic syndrome with unspecified morphologic changes: Secondary | ICD-10-CM | POA: Diagnosis not present

## 2018-01-15 DIAGNOSIS — Z992 Dependence on renal dialysis: Secondary | ICD-10-CM | POA: Diagnosis not present

## 2018-01-15 DIAGNOSIS — N186 End stage renal disease: Secondary | ICD-10-CM | POA: Diagnosis not present

## 2018-01-16 DIAGNOSIS — N186 End stage renal disease: Secondary | ICD-10-CM | POA: Diagnosis not present

## 2018-01-16 DIAGNOSIS — D631 Anemia in chronic kidney disease: Secondary | ICD-10-CM | POA: Diagnosis not present

## 2018-01-16 DIAGNOSIS — N2581 Secondary hyperparathyroidism of renal origin: Secondary | ICD-10-CM | POA: Diagnosis not present

## 2018-01-18 DIAGNOSIS — N186 End stage renal disease: Secondary | ICD-10-CM | POA: Diagnosis not present

## 2018-01-18 DIAGNOSIS — D631 Anemia in chronic kidney disease: Secondary | ICD-10-CM | POA: Diagnosis not present

## 2018-01-18 DIAGNOSIS — N2581 Secondary hyperparathyroidism of renal origin: Secondary | ICD-10-CM | POA: Diagnosis not present

## 2018-01-20 DIAGNOSIS — N186 End stage renal disease: Secondary | ICD-10-CM | POA: Diagnosis not present

## 2018-01-20 DIAGNOSIS — N2581 Secondary hyperparathyroidism of renal origin: Secondary | ICD-10-CM | POA: Diagnosis not present

## 2018-01-20 DIAGNOSIS — D631 Anemia in chronic kidney disease: Secondary | ICD-10-CM | POA: Diagnosis not present

## 2018-01-23 DIAGNOSIS — N2581 Secondary hyperparathyroidism of renal origin: Secondary | ICD-10-CM | POA: Diagnosis not present

## 2018-01-23 DIAGNOSIS — N186 End stage renal disease: Secondary | ICD-10-CM | POA: Diagnosis not present

## 2018-01-23 DIAGNOSIS — D631 Anemia in chronic kidney disease: Secondary | ICD-10-CM | POA: Diagnosis not present

## 2018-01-25 DIAGNOSIS — N2581 Secondary hyperparathyroidism of renal origin: Secondary | ICD-10-CM | POA: Diagnosis not present

## 2018-01-25 DIAGNOSIS — N186 End stage renal disease: Secondary | ICD-10-CM | POA: Diagnosis not present

## 2018-01-25 DIAGNOSIS — D631 Anemia in chronic kidney disease: Secondary | ICD-10-CM | POA: Diagnosis not present

## 2018-01-29 DIAGNOSIS — N2581 Secondary hyperparathyroidism of renal origin: Secondary | ICD-10-CM | POA: Diagnosis not present

## 2018-01-29 DIAGNOSIS — N186 End stage renal disease: Secondary | ICD-10-CM | POA: Diagnosis not present

## 2018-01-29 DIAGNOSIS — D631 Anemia in chronic kidney disease: Secondary | ICD-10-CM | POA: Diagnosis not present

## 2018-02-01 DIAGNOSIS — D631 Anemia in chronic kidney disease: Secondary | ICD-10-CM | POA: Diagnosis not present

## 2018-02-01 DIAGNOSIS — N2581 Secondary hyperparathyroidism of renal origin: Secondary | ICD-10-CM | POA: Diagnosis not present

## 2018-02-01 DIAGNOSIS — N186 End stage renal disease: Secondary | ICD-10-CM | POA: Diagnosis not present

## 2018-02-03 DIAGNOSIS — N186 End stage renal disease: Secondary | ICD-10-CM | POA: Diagnosis not present

## 2018-02-03 DIAGNOSIS — N2581 Secondary hyperparathyroidism of renal origin: Secondary | ICD-10-CM | POA: Diagnosis not present

## 2018-02-03 DIAGNOSIS — D631 Anemia in chronic kidney disease: Secondary | ICD-10-CM | POA: Diagnosis not present

## 2018-02-06 DIAGNOSIS — D631 Anemia in chronic kidney disease: Secondary | ICD-10-CM | POA: Diagnosis not present

## 2018-02-06 DIAGNOSIS — N2581 Secondary hyperparathyroidism of renal origin: Secondary | ICD-10-CM | POA: Diagnosis not present

## 2018-02-06 DIAGNOSIS — N186 End stage renal disease: Secondary | ICD-10-CM | POA: Diagnosis not present

## 2018-02-08 DIAGNOSIS — N186 End stage renal disease: Secondary | ICD-10-CM | POA: Diagnosis not present

## 2018-02-08 DIAGNOSIS — D631 Anemia in chronic kidney disease: Secondary | ICD-10-CM | POA: Diagnosis not present

## 2018-02-08 DIAGNOSIS — N2581 Secondary hyperparathyroidism of renal origin: Secondary | ICD-10-CM | POA: Diagnosis not present

## 2018-02-10 DIAGNOSIS — N2581 Secondary hyperparathyroidism of renal origin: Secondary | ICD-10-CM | POA: Diagnosis not present

## 2018-02-10 DIAGNOSIS — N186 End stage renal disease: Secondary | ICD-10-CM | POA: Diagnosis not present

## 2018-02-10 DIAGNOSIS — D631 Anemia in chronic kidney disease: Secondary | ICD-10-CM | POA: Diagnosis not present

## 2018-02-13 DIAGNOSIS — N186 End stage renal disease: Secondary | ICD-10-CM | POA: Diagnosis not present

## 2018-02-13 DIAGNOSIS — D631 Anemia in chronic kidney disease: Secondary | ICD-10-CM | POA: Diagnosis not present

## 2018-02-13 DIAGNOSIS — N2581 Secondary hyperparathyroidism of renal origin: Secondary | ICD-10-CM | POA: Diagnosis not present

## 2018-02-15 DIAGNOSIS — N186 End stage renal disease: Secondary | ICD-10-CM | POA: Diagnosis not present

## 2018-02-15 DIAGNOSIS — D631 Anemia in chronic kidney disease: Secondary | ICD-10-CM | POA: Diagnosis not present

## 2018-02-15 DIAGNOSIS — N049 Nephrotic syndrome with unspecified morphologic changes: Secondary | ICD-10-CM | POA: Diagnosis not present

## 2018-02-15 DIAGNOSIS — N2581 Secondary hyperparathyroidism of renal origin: Secondary | ICD-10-CM | POA: Diagnosis not present

## 2018-02-15 DIAGNOSIS — Z992 Dependence on renal dialysis: Secondary | ICD-10-CM | POA: Diagnosis not present

## 2018-02-20 DIAGNOSIS — N186 End stage renal disease: Secondary | ICD-10-CM | POA: Diagnosis not present

## 2018-02-20 DIAGNOSIS — D631 Anemia in chronic kidney disease: Secondary | ICD-10-CM | POA: Diagnosis not present

## 2018-02-20 DIAGNOSIS — N2581 Secondary hyperparathyroidism of renal origin: Secondary | ICD-10-CM | POA: Diagnosis not present

## 2018-02-22 DIAGNOSIS — D631 Anemia in chronic kidney disease: Secondary | ICD-10-CM | POA: Diagnosis not present

## 2018-02-22 DIAGNOSIS — N2581 Secondary hyperparathyroidism of renal origin: Secondary | ICD-10-CM | POA: Diagnosis not present

## 2018-02-22 DIAGNOSIS — N186 End stage renal disease: Secondary | ICD-10-CM | POA: Diagnosis not present

## 2018-02-24 DIAGNOSIS — D631 Anemia in chronic kidney disease: Secondary | ICD-10-CM | POA: Diagnosis not present

## 2018-02-24 DIAGNOSIS — N186 End stage renal disease: Secondary | ICD-10-CM | POA: Diagnosis not present

## 2018-02-24 DIAGNOSIS — N2581 Secondary hyperparathyroidism of renal origin: Secondary | ICD-10-CM | POA: Diagnosis not present

## 2018-02-27 DIAGNOSIS — N186 End stage renal disease: Secondary | ICD-10-CM | POA: Diagnosis not present

## 2018-02-27 DIAGNOSIS — D631 Anemia in chronic kidney disease: Secondary | ICD-10-CM | POA: Diagnosis not present

## 2018-02-27 DIAGNOSIS — N2581 Secondary hyperparathyroidism of renal origin: Secondary | ICD-10-CM | POA: Diagnosis not present

## 2018-03-01 DIAGNOSIS — D631 Anemia in chronic kidney disease: Secondary | ICD-10-CM | POA: Diagnosis not present

## 2018-03-01 DIAGNOSIS — N186 End stage renal disease: Secondary | ICD-10-CM | POA: Diagnosis not present

## 2018-03-01 DIAGNOSIS — N2581 Secondary hyperparathyroidism of renal origin: Secondary | ICD-10-CM | POA: Diagnosis not present

## 2018-03-03 DIAGNOSIS — D631 Anemia in chronic kidney disease: Secondary | ICD-10-CM | POA: Diagnosis not present

## 2018-03-03 DIAGNOSIS — N2581 Secondary hyperparathyroidism of renal origin: Secondary | ICD-10-CM | POA: Diagnosis not present

## 2018-03-03 DIAGNOSIS — N186 End stage renal disease: Secondary | ICD-10-CM | POA: Diagnosis not present

## 2018-03-06 DIAGNOSIS — N186 End stage renal disease: Secondary | ICD-10-CM | POA: Diagnosis not present

## 2018-03-06 DIAGNOSIS — D631 Anemia in chronic kidney disease: Secondary | ICD-10-CM | POA: Diagnosis not present

## 2018-03-06 DIAGNOSIS — N2581 Secondary hyperparathyroidism of renal origin: Secondary | ICD-10-CM | POA: Diagnosis not present

## 2018-03-08 DIAGNOSIS — N2581 Secondary hyperparathyroidism of renal origin: Secondary | ICD-10-CM | POA: Diagnosis not present

## 2018-03-08 DIAGNOSIS — N186 End stage renal disease: Secondary | ICD-10-CM | POA: Diagnosis not present

## 2018-03-08 DIAGNOSIS — D631 Anemia in chronic kidney disease: Secondary | ICD-10-CM | POA: Diagnosis not present

## 2018-03-10 DIAGNOSIS — D631 Anemia in chronic kidney disease: Secondary | ICD-10-CM | POA: Diagnosis not present

## 2018-03-10 DIAGNOSIS — N186 End stage renal disease: Secondary | ICD-10-CM | POA: Diagnosis not present

## 2018-03-10 DIAGNOSIS — N2581 Secondary hyperparathyroidism of renal origin: Secondary | ICD-10-CM | POA: Diagnosis not present

## 2018-03-13 DIAGNOSIS — N186 End stage renal disease: Secondary | ICD-10-CM | POA: Diagnosis not present

## 2018-03-13 DIAGNOSIS — D631 Anemia in chronic kidney disease: Secondary | ICD-10-CM | POA: Diagnosis not present

## 2018-03-13 DIAGNOSIS — N2581 Secondary hyperparathyroidism of renal origin: Secondary | ICD-10-CM | POA: Diagnosis not present

## 2018-03-15 DIAGNOSIS — N2581 Secondary hyperparathyroidism of renal origin: Secondary | ICD-10-CM | POA: Diagnosis not present

## 2018-03-15 DIAGNOSIS — N186 End stage renal disease: Secondary | ICD-10-CM | POA: Diagnosis not present

## 2018-03-15 DIAGNOSIS — D631 Anemia in chronic kidney disease: Secondary | ICD-10-CM | POA: Diagnosis not present

## 2018-03-17 DIAGNOSIS — N2581 Secondary hyperparathyroidism of renal origin: Secondary | ICD-10-CM | POA: Diagnosis not present

## 2018-03-17 DIAGNOSIS — N186 End stage renal disease: Secondary | ICD-10-CM | POA: Diagnosis not present

## 2018-03-17 DIAGNOSIS — D631 Anemia in chronic kidney disease: Secondary | ICD-10-CM | POA: Diagnosis not present

## 2018-03-18 DIAGNOSIS — N186 End stage renal disease: Secondary | ICD-10-CM | POA: Diagnosis not present

## 2018-03-18 DIAGNOSIS — Z992 Dependence on renal dialysis: Secondary | ICD-10-CM | POA: Diagnosis not present

## 2018-03-18 DIAGNOSIS — N049 Nephrotic syndrome with unspecified morphologic changes: Secondary | ICD-10-CM | POA: Diagnosis not present

## 2018-03-20 DIAGNOSIS — E611 Iron deficiency: Secondary | ICD-10-CM | POA: Diagnosis not present

## 2018-03-20 DIAGNOSIS — N2581 Secondary hyperparathyroidism of renal origin: Secondary | ICD-10-CM | POA: Diagnosis not present

## 2018-03-20 DIAGNOSIS — N186 End stage renal disease: Secondary | ICD-10-CM | POA: Diagnosis not present

## 2018-03-20 DIAGNOSIS — D631 Anemia in chronic kidney disease: Secondary | ICD-10-CM | POA: Diagnosis not present

## 2018-03-22 DIAGNOSIS — N186 End stage renal disease: Secondary | ICD-10-CM | POA: Diagnosis not present

## 2018-03-22 DIAGNOSIS — N2581 Secondary hyperparathyroidism of renal origin: Secondary | ICD-10-CM | POA: Diagnosis not present

## 2018-03-22 DIAGNOSIS — D631 Anemia in chronic kidney disease: Secondary | ICD-10-CM | POA: Diagnosis not present

## 2018-03-22 DIAGNOSIS — E611 Iron deficiency: Secondary | ICD-10-CM | POA: Diagnosis not present

## 2018-03-24 DIAGNOSIS — D631 Anemia in chronic kidney disease: Secondary | ICD-10-CM | POA: Diagnosis not present

## 2018-03-24 DIAGNOSIS — N2581 Secondary hyperparathyroidism of renal origin: Secondary | ICD-10-CM | POA: Diagnosis not present

## 2018-03-24 DIAGNOSIS — N186 End stage renal disease: Secondary | ICD-10-CM | POA: Diagnosis not present

## 2018-03-24 DIAGNOSIS — E611 Iron deficiency: Secondary | ICD-10-CM | POA: Diagnosis not present

## 2018-03-27 DIAGNOSIS — D631 Anemia in chronic kidney disease: Secondary | ICD-10-CM | POA: Diagnosis not present

## 2018-03-27 DIAGNOSIS — N186 End stage renal disease: Secondary | ICD-10-CM | POA: Diagnosis not present

## 2018-03-27 DIAGNOSIS — E611 Iron deficiency: Secondary | ICD-10-CM | POA: Diagnosis not present

## 2018-03-27 DIAGNOSIS — N2581 Secondary hyperparathyroidism of renal origin: Secondary | ICD-10-CM | POA: Diagnosis not present

## 2018-03-29 DIAGNOSIS — N2581 Secondary hyperparathyroidism of renal origin: Secondary | ICD-10-CM | POA: Diagnosis not present

## 2018-03-29 DIAGNOSIS — D631 Anemia in chronic kidney disease: Secondary | ICD-10-CM | POA: Diagnosis not present

## 2018-03-29 DIAGNOSIS — N186 End stage renal disease: Secondary | ICD-10-CM | POA: Diagnosis not present

## 2018-03-29 DIAGNOSIS — E611 Iron deficiency: Secondary | ICD-10-CM | POA: Diagnosis not present

## 2018-03-31 DIAGNOSIS — D631 Anemia in chronic kidney disease: Secondary | ICD-10-CM | POA: Diagnosis not present

## 2018-03-31 DIAGNOSIS — N2581 Secondary hyperparathyroidism of renal origin: Secondary | ICD-10-CM | POA: Diagnosis not present

## 2018-03-31 DIAGNOSIS — E611 Iron deficiency: Secondary | ICD-10-CM | POA: Diagnosis not present

## 2018-03-31 DIAGNOSIS — N186 End stage renal disease: Secondary | ICD-10-CM | POA: Diagnosis not present

## 2018-04-03 DIAGNOSIS — E611 Iron deficiency: Secondary | ICD-10-CM | POA: Diagnosis not present

## 2018-04-03 DIAGNOSIS — N186 End stage renal disease: Secondary | ICD-10-CM | POA: Diagnosis not present

## 2018-04-03 DIAGNOSIS — D631 Anemia in chronic kidney disease: Secondary | ICD-10-CM | POA: Diagnosis not present

## 2018-04-03 DIAGNOSIS — N2581 Secondary hyperparathyroidism of renal origin: Secondary | ICD-10-CM | POA: Diagnosis not present

## 2018-04-05 DIAGNOSIS — E611 Iron deficiency: Secondary | ICD-10-CM | POA: Diagnosis not present

## 2018-04-05 DIAGNOSIS — N186 End stage renal disease: Secondary | ICD-10-CM | POA: Diagnosis not present

## 2018-04-05 DIAGNOSIS — N2581 Secondary hyperparathyroidism of renal origin: Secondary | ICD-10-CM | POA: Diagnosis not present

## 2018-04-05 DIAGNOSIS — D631 Anemia in chronic kidney disease: Secondary | ICD-10-CM | POA: Diagnosis not present

## 2018-04-07 DIAGNOSIS — D631 Anemia in chronic kidney disease: Secondary | ICD-10-CM | POA: Diagnosis not present

## 2018-04-07 DIAGNOSIS — N2581 Secondary hyperparathyroidism of renal origin: Secondary | ICD-10-CM | POA: Diagnosis not present

## 2018-04-07 DIAGNOSIS — E611 Iron deficiency: Secondary | ICD-10-CM | POA: Diagnosis not present

## 2018-04-07 DIAGNOSIS — N186 End stage renal disease: Secondary | ICD-10-CM | POA: Diagnosis not present

## 2018-04-09 DIAGNOSIS — L570 Actinic keratosis: Secondary | ICD-10-CM | POA: Diagnosis not present

## 2018-04-09 DIAGNOSIS — L218 Other seborrheic dermatitis: Secondary | ICD-10-CM | POA: Diagnosis not present

## 2018-04-09 DIAGNOSIS — X32XXXA Exposure to sunlight, initial encounter: Secondary | ICD-10-CM | POA: Diagnosis not present

## 2018-04-09 DIAGNOSIS — L308 Other specified dermatitis: Secondary | ICD-10-CM | POA: Diagnosis not present

## 2018-04-10 DIAGNOSIS — N186 End stage renal disease: Secondary | ICD-10-CM | POA: Diagnosis not present

## 2018-04-10 DIAGNOSIS — D631 Anemia in chronic kidney disease: Secondary | ICD-10-CM | POA: Diagnosis not present

## 2018-04-10 DIAGNOSIS — N2581 Secondary hyperparathyroidism of renal origin: Secondary | ICD-10-CM | POA: Diagnosis not present

## 2018-04-10 DIAGNOSIS — E611 Iron deficiency: Secondary | ICD-10-CM | POA: Diagnosis not present

## 2018-04-12 DIAGNOSIS — I10 Essential (primary) hypertension: Secondary | ICD-10-CM | POA: Diagnosis not present

## 2018-04-12 DIAGNOSIS — D631 Anemia in chronic kidney disease: Secondary | ICD-10-CM | POA: Diagnosis not present

## 2018-04-12 DIAGNOSIS — E611 Iron deficiency: Secondary | ICD-10-CM | POA: Diagnosis not present

## 2018-04-12 DIAGNOSIS — N186 End stage renal disease: Secondary | ICD-10-CM | POA: Diagnosis not present

## 2018-04-12 DIAGNOSIS — N2581 Secondary hyperparathyroidism of renal origin: Secondary | ICD-10-CM | POA: Diagnosis not present

## 2018-04-12 DIAGNOSIS — E7800 Pure hypercholesterolemia, unspecified: Secondary | ICD-10-CM | POA: Diagnosis not present

## 2018-04-12 DIAGNOSIS — Z125 Encounter for screening for malignant neoplasm of prostate: Secondary | ICD-10-CM | POA: Diagnosis not present

## 2018-04-12 DIAGNOSIS — H903 Sensorineural hearing loss, bilateral: Secondary | ICD-10-CM | POA: Diagnosis not present

## 2018-04-12 DIAGNOSIS — Z0000 Encounter for general adult medical examination without abnormal findings: Secondary | ICD-10-CM | POA: Diagnosis not present

## 2018-04-17 DIAGNOSIS — E611 Iron deficiency: Secondary | ICD-10-CM | POA: Diagnosis not present

## 2018-04-17 DIAGNOSIS — N049 Nephrotic syndrome with unspecified morphologic changes: Secondary | ICD-10-CM | POA: Diagnosis not present

## 2018-04-17 DIAGNOSIS — D631 Anemia in chronic kidney disease: Secondary | ICD-10-CM | POA: Diagnosis not present

## 2018-04-17 DIAGNOSIS — N2581 Secondary hyperparathyroidism of renal origin: Secondary | ICD-10-CM | POA: Diagnosis not present

## 2018-04-17 DIAGNOSIS — N186 End stage renal disease: Secondary | ICD-10-CM | POA: Diagnosis not present

## 2018-04-17 DIAGNOSIS — Z992 Dependence on renal dialysis: Secondary | ICD-10-CM | POA: Diagnosis not present

## 2018-04-19 DIAGNOSIS — N2581 Secondary hyperparathyroidism of renal origin: Secondary | ICD-10-CM | POA: Diagnosis not present

## 2018-04-19 DIAGNOSIS — D631 Anemia in chronic kidney disease: Secondary | ICD-10-CM | POA: Diagnosis not present

## 2018-04-19 DIAGNOSIS — N186 End stage renal disease: Secondary | ICD-10-CM | POA: Diagnosis not present

## 2018-04-19 DIAGNOSIS — E611 Iron deficiency: Secondary | ICD-10-CM | POA: Diagnosis not present

## 2018-04-21 DIAGNOSIS — N2581 Secondary hyperparathyroidism of renal origin: Secondary | ICD-10-CM | POA: Diagnosis not present

## 2018-04-21 DIAGNOSIS — N186 End stage renal disease: Secondary | ICD-10-CM | POA: Diagnosis not present

## 2018-04-21 DIAGNOSIS — E611 Iron deficiency: Secondary | ICD-10-CM | POA: Diagnosis not present

## 2018-04-21 DIAGNOSIS — D631 Anemia in chronic kidney disease: Secondary | ICD-10-CM | POA: Diagnosis not present

## 2018-04-24 DIAGNOSIS — E611 Iron deficiency: Secondary | ICD-10-CM | POA: Diagnosis not present

## 2018-04-24 DIAGNOSIS — N186 End stage renal disease: Secondary | ICD-10-CM | POA: Diagnosis not present

## 2018-04-24 DIAGNOSIS — N2581 Secondary hyperparathyroidism of renal origin: Secondary | ICD-10-CM | POA: Diagnosis not present

## 2018-04-24 DIAGNOSIS — D631 Anemia in chronic kidney disease: Secondary | ICD-10-CM | POA: Diagnosis not present

## 2018-04-26 DIAGNOSIS — N2581 Secondary hyperparathyroidism of renal origin: Secondary | ICD-10-CM | POA: Diagnosis not present

## 2018-04-26 DIAGNOSIS — D631 Anemia in chronic kidney disease: Secondary | ICD-10-CM | POA: Diagnosis not present

## 2018-04-26 DIAGNOSIS — E611 Iron deficiency: Secondary | ICD-10-CM | POA: Diagnosis not present

## 2018-04-26 DIAGNOSIS — N186 End stage renal disease: Secondary | ICD-10-CM | POA: Diagnosis not present

## 2018-04-28 DIAGNOSIS — N186 End stage renal disease: Secondary | ICD-10-CM | POA: Diagnosis not present

## 2018-04-28 DIAGNOSIS — D631 Anemia in chronic kidney disease: Secondary | ICD-10-CM | POA: Diagnosis not present

## 2018-04-28 DIAGNOSIS — E611 Iron deficiency: Secondary | ICD-10-CM | POA: Diagnosis not present

## 2018-04-28 DIAGNOSIS — N2581 Secondary hyperparathyroidism of renal origin: Secondary | ICD-10-CM | POA: Diagnosis not present

## 2018-05-01 DIAGNOSIS — N186 End stage renal disease: Secondary | ICD-10-CM | POA: Diagnosis not present

## 2018-05-01 DIAGNOSIS — D631 Anemia in chronic kidney disease: Secondary | ICD-10-CM | POA: Diagnosis not present

## 2018-05-01 DIAGNOSIS — E611 Iron deficiency: Secondary | ICD-10-CM | POA: Diagnosis not present

## 2018-05-01 DIAGNOSIS — N2581 Secondary hyperparathyroidism of renal origin: Secondary | ICD-10-CM | POA: Diagnosis not present

## 2018-05-03 ENCOUNTER — Other Ambulatory Visit: Payer: Self-pay

## 2018-05-03 ENCOUNTER — Emergency Department (HOSPITAL_COMMUNITY): Payer: Medicare Other

## 2018-05-03 ENCOUNTER — Observation Stay (HOSPITAL_COMMUNITY)
Admission: EM | Admit: 2018-05-03 | Discharge: 2018-05-04 | Disposition: A | Discharge status: Home / Self Care | Payer: Medicare Other | Attending: Internal Medicine | Admitting: Internal Medicine

## 2018-05-03 ENCOUNTER — Observation Stay (HOSPITAL_COMMUNITY): Payer: Medicare Other

## 2018-05-03 ENCOUNTER — Encounter (HOSPITAL_COMMUNITY): Payer: Self-pay

## 2018-05-03 DIAGNOSIS — Z87891 Personal history of nicotine dependence: Secondary | ICD-10-CM | POA: Diagnosis not present

## 2018-05-03 DIAGNOSIS — Z992 Dependence on renal dialysis: Secondary | ICD-10-CM | POA: Diagnosis not present

## 2018-05-03 DIAGNOSIS — N186 End stage renal disease: Secondary | ICD-10-CM

## 2018-05-03 DIAGNOSIS — E875 Hyperkalemia: Secondary | ICD-10-CM

## 2018-05-03 DIAGNOSIS — Z79899 Other long term (current) drug therapy: Secondary | ICD-10-CM | POA: Diagnosis not present

## 2018-05-03 DIAGNOSIS — G459 Transient cerebral ischemic attack, unspecified: Principal | ICD-10-CM | POA: Diagnosis present

## 2018-05-03 DIAGNOSIS — N2589 Other disorders resulting from impaired renal tubular function: Secondary | ICD-10-CM | POA: Diagnosis not present

## 2018-05-03 DIAGNOSIS — R202 Paresthesia of skin: Secondary | ICD-10-CM

## 2018-05-03 DIAGNOSIS — Z888 Allergy status to other drugs, medicaments and biological substances status: Secondary | ICD-10-CM | POA: Diagnosis not present

## 2018-05-03 DIAGNOSIS — R2689 Other abnormalities of gait and mobility: Secondary | ICD-10-CM | POA: Insufficient documentation

## 2018-05-03 DIAGNOSIS — E785 Hyperlipidemia, unspecified: Secondary | ICD-10-CM | POA: Insufficient documentation

## 2018-05-03 DIAGNOSIS — Z7982 Long term (current) use of aspirin: Secondary | ICD-10-CM | POA: Insufficient documentation

## 2018-05-03 DIAGNOSIS — R29810 Facial weakness: Secondary | ICD-10-CM | POA: Diagnosis not present

## 2018-05-03 DIAGNOSIS — I10 Essential (primary) hypertension: Secondary | ICD-10-CM | POA: Diagnosis not present

## 2018-05-03 DIAGNOSIS — I120 Hypertensive chronic kidney disease with stage 5 chronic kidney disease or end stage renal disease: Secondary | ICD-10-CM | POA: Diagnosis not present

## 2018-05-03 DIAGNOSIS — I071 Rheumatic tricuspid insufficiency: Secondary | ICD-10-CM | POA: Insufficient documentation

## 2018-05-03 DIAGNOSIS — N2581 Secondary hyperparathyroidism of renal origin: Secondary | ICD-10-CM | POA: Insufficient documentation

## 2018-05-03 DIAGNOSIS — D631 Anemia in chronic kidney disease: Secondary | ICD-10-CM | POA: Insufficient documentation

## 2018-05-03 DIAGNOSIS — E611 Iron deficiency: Secondary | ICD-10-CM | POA: Diagnosis not present

## 2018-05-03 HISTORY — DX: Dependence on renal dialysis: Z99.2

## 2018-05-03 LAB — HEMOGLOBIN A1C
Hgb A1c MFr Bld: 4.7 % — ABNORMAL LOW (ref 4.8–5.6)
Mean Plasma Glucose: 88.19 mg/dL

## 2018-05-03 LAB — DIFFERENTIAL
ABS IMMATURE GRANULOCYTES: 0.04 10*3/uL (ref 0.00–0.07)
Basophils Absolute: 0.1 10*3/uL (ref 0.0–0.1)
Basophils Relative: 1 %
Eosinophils Absolute: 0.4 10*3/uL (ref 0.0–0.5)
Eosinophils Relative: 4 %
Immature Granulocytes: 0 %
LYMPHS ABS: 1.8 10*3/uL (ref 0.7–4.0)
Lymphocytes Relative: 18 %
MONOS PCT: 11 %
Monocytes Absolute: 1.1 10*3/uL — ABNORMAL HIGH (ref 0.1–1.0)
NEUTROS ABS: 6.4 10*3/uL (ref 1.7–7.7)
Neutrophils Relative %: 66 %

## 2018-05-03 LAB — I-STAT CHEM 8, ED
BUN: 48 mg/dL — ABNORMAL HIGH (ref 8–23)
CHLORIDE: 100 mmol/L (ref 98–111)
CREATININE: 10.6 mg/dL — AB (ref 0.61–1.24)
Calcium, Ion: 1.11 mmol/L — ABNORMAL LOW (ref 1.15–1.40)
GLUCOSE: 164 mg/dL — AB (ref 70–99)
HCT: 33 % — ABNORMAL LOW (ref 39.0–52.0)
Hemoglobin: 11.2 g/dL — ABNORMAL LOW (ref 13.0–17.0)
POTASSIUM: 5.5 mmol/L — AB (ref 3.5–5.1)
Sodium: 139 mmol/L (ref 135–145)
TCO2: 30 mmol/L (ref 22–32)

## 2018-05-03 LAB — CBG MONITORING, ED: Glucose-Capillary: 152 mg/dL — ABNORMAL HIGH (ref 70–99)

## 2018-05-03 LAB — CBC
HCT: 34.3 % — ABNORMAL LOW (ref 39.0–52.0)
HCT: 32.3 % — ABNORMAL LOW (ref 39.0–52.0)
Hemoglobin: 11.3 g/dL — ABNORMAL LOW (ref 13.0–17.0)
Hemoglobin: 10.4 g/dL — ABNORMAL LOW (ref 13.0–17.0)
MCH: 33.3 pg (ref 26.0–34.0)
MCH: 32.4 pg (ref 26.0–34.0)
MCHC: 32.9 g/dL (ref 30.0–36.0)
MCHC: 32.2 g/dL (ref 30.0–36.0)
MCV: 101.2 fL — ABNORMAL HIGH (ref 80.0–100.0)
MCV: 100.6 fL — ABNORMAL HIGH (ref 80.0–100.0)
PLATELETS: 169 10*3/uL (ref 150–400)
Platelets: 156 10*3/uL (ref 150–400)
RBC: 3.39 MIL/uL — ABNORMAL LOW (ref 4.22–5.81)
RBC: 3.21 MIL/uL — ABNORMAL LOW (ref 4.22–5.81)
RDW: 14.4 % (ref 11.5–15.5)
RDW: 14.5 % (ref 11.5–15.5)
WBC: 9.7 10*3/uL (ref 4.0–10.5)
WBC: 9.1 10*3/uL (ref 4.0–10.5)
nRBC: 0 % (ref 0.0–0.2)
nRBC: 0 % (ref 0.0–0.2)

## 2018-05-03 LAB — COMPREHENSIVE METABOLIC PANEL
ALT: 20 U/L (ref 0–44)
AST: 25 U/L (ref 15–41)
Albumin: 3.9 g/dL (ref 3.5–5.0)
Alkaline Phosphatase: 53 U/L (ref 38–126)
Anion gap: 14 (ref 5–15)
BILIRUBIN TOTAL: 0.9 mg/dL (ref 0.3–1.2)
BUN: 49 mg/dL — AB (ref 8–23)
CO2: 28 mmol/L (ref 22–32)
CREATININE: 9.99 mg/dL — AB (ref 0.61–1.24)
Calcium: 9.8 mg/dL (ref 8.9–10.3)
Chloride: 100 mmol/L (ref 98–111)
GFR calc Af Amer: 5 mL/min — ABNORMAL LOW (ref >60)
GFR, EST NON AFRICAN AMERICAN: 5 mL/min — AB (ref >60)
Glucose, Bld: 166 mg/dL — ABNORMAL HIGH (ref 70–99)
Potassium: 5.7 mmol/L — ABNORMAL HIGH (ref 3.5–5.1)
Sodium: 142 mmol/L (ref 135–145)
TOTAL PROTEIN: 6.2 g/dL — AB (ref 6.5–8.1)

## 2018-05-03 LAB — LIPID PANEL
Cholesterol: 96 mg/dL (ref 0–200)
HDL: 66 mg/dL (ref >40)
LDL Cholesterol: 20 mg/dL (ref 0–99)
Total CHOL/HDL Ratio: 1.5 RATIO
Triglycerides: 52 mg/dL (ref <150)
VLDL: 10 mg/dL (ref 0–40)

## 2018-05-03 LAB — URINALYSIS, COMPLETE (UACMP) WITH MICROSCOPIC
Bacteria, UA: NONE SEEN
Bilirubin Urine: NEGATIVE
Glucose, UA: >=500 mg/dL — AB
Hgb urine dipstick: NEGATIVE
Ketones, ur: NEGATIVE mg/dL
Leukocytes, UA: NEGATIVE
Nitrite: NEGATIVE
Protein, ur: >=300 mg/dL — AB
Specific Gravity, Urine: 1.013 (ref 1.005–1.030)
pH: 9 — ABNORMAL HIGH (ref 5.0–8.0)

## 2018-05-03 LAB — CREATININE, SERUM
Creatinine, Ser: 10.59 mg/dL — ABNORMAL HIGH (ref 0.61–1.24)
GFR calc Af Amer: 5 mL/min — ABNORMAL LOW (ref >60)
GFR calc non Af Amer: 4 mL/min — ABNORMAL LOW (ref >60)

## 2018-05-03 LAB — PHOSPHORUS: Phosphorus: 6.1 mg/dL — ABNORMAL HIGH (ref 2.5–4.6)

## 2018-05-03 LAB — APTT: aPTT: 40 seconds — ABNORMAL HIGH (ref 24–36)

## 2018-05-03 LAB — PROTIME-INR
INR: 1.04
PROTHROMBIN TIME: 13.5 s (ref 11.4–15.2)

## 2018-05-03 LAB — I-STAT TROPONIN, ED: TROPONIN I, POC: 0.05 ng/mL (ref 0.00–0.08)

## 2018-05-03 LAB — MAGNESIUM: Magnesium: 3 mg/dL — ABNORMAL HIGH (ref 1.7–2.4)

## 2018-05-03 MED ORDER — CHLORHEXIDINE GLUCONATE CLOTH 2 % EX PADS: 6.0000 | MEDICATED_PAD | Freq: Every day | CUTANEOUS | Status: DC

## 2018-05-03 MED ORDER — SODIUM CHLORIDE 0.9 % IV SOLN: 100.0000 mL | INTRAVENOUS | Status: DC | PRN

## 2018-05-03 MED ORDER — CALCITRIOL 0.5 MCG PO CAPS
0.5000 ug | ORAL_CAPSULE | Freq: Every day | ORAL | Status: DC
  Administered 2018-05-04: 0.5 ug via ORAL
  Filled 2018-05-03: qty 1

## 2018-05-03 MED ORDER — PENTAFLUOROPROP-TETRAFLUOROETH EX AERO: 1.0000 "application " | INHALATION_SPRAY | CUTANEOUS | Status: DC | PRN

## 2018-05-03 MED ORDER — CARVEDILOL 12.5 MG PO TABS
12.5000 mg | ORAL_TABLET | Freq: Two times a day (BID) | ORAL | Status: DC
  Administered 2018-05-03 – 2018-05-04 (×2): 12.5 mg via ORAL
  Filled 2018-05-03 (×2): qty 1

## 2018-05-03 MED ORDER — HEPARIN SODIUM (PORCINE) 1000 UNIT/ML DIALYSIS
1800.0000 [IU] | INTRAMUSCULAR | Status: DC | PRN
  Filled 2018-05-03: qty 2

## 2018-05-03 MED ORDER — ALLOPURINOL 100 MG PO TABS
100.0000 mg | ORAL_TABLET | Freq: Every day | ORAL | Status: DC
  Administered 2018-05-04: 100 mg via ORAL
  Filled 2018-05-03: qty 1

## 2018-05-03 MED ORDER — ALTEPLASE 2 MG IJ SOLR
2.0000 mg | Freq: Once | INTRAMUSCULAR | Status: DC | PRN
  Filled 2018-05-03: qty 2

## 2018-05-03 MED ORDER — SODIUM BICARBONATE 650 MG PO TABS
1950.0000 mg | ORAL_TABLET | Freq: Two times a day (BID) | ORAL | Status: DC
  Administered 2018-05-03 – 2018-05-04 (×2): 1950 mg via ORAL
  Filled 2018-05-03 (×3): qty 3

## 2018-05-03 MED ORDER — LIDOCAINE-PRILOCAINE 2.5-2.5 % EX CREA: 1.0000 "application " | TOPICAL_CREAM | CUTANEOUS | Status: DC | PRN

## 2018-05-03 MED ORDER — DOXERCALCIFEROL 4 MCG/2ML IV SOLN: 2.0000 ug | INTRAVENOUS | Status: DC

## 2018-05-03 MED ORDER — ATORVASTATIN CALCIUM 20 MG PO TABS
20.0000 mg | ORAL_TABLET | Freq: Every day | ORAL | Status: DC
  Administered 2018-05-04: 20 mg via ORAL
  Filled 2018-05-03: qty 1

## 2018-05-03 MED ORDER — OXYCODONE-ACETAMINOPHEN 5-325 MG PO TABS: 1.0000 | ORAL_TABLET | Freq: Four times a day (QID) | ORAL | Status: DC | PRN

## 2018-05-03 MED ORDER — LIDOCAINE HCL (PF) 1 % IJ SOLN: 5.0000 mL | INTRAMUSCULAR | Status: DC | PRN

## 2018-05-03 MED ORDER — ASPIRIN 325 MG PO TABS
325.0000 mg | ORAL_TABLET | Freq: Every day | ORAL | Status: DC
  Administered 2018-05-04: 325 mg via ORAL
  Filled 2018-05-03: qty 1

## 2018-05-03 MED ORDER — HEPARIN SODIUM (PORCINE) 5000 UNIT/ML IJ SOLN
5000.0000 [IU] | Freq: Three times a day (TID) | INTRAMUSCULAR | Status: DC
  Administered 2018-05-03 – 2018-05-04 (×2): 5000 [IU] via SUBCUTANEOUS
  Filled 2018-05-03 (×2): qty 1

## 2018-05-03 NOTE — ED Provider Notes (Signed)
[name]   CSN: [id number] Arrival date & time: 05/03/18  1319     History   Chief Complaint Chief Complaint  Patient presents with  . Neurologic Problem    HPI Rodney Bridges is a 72 y.o. male.  Patient with history of hypertension, end-stage renal disease on dialysis for approximately a year and a half presents the emergency department today with concern for stroke symptoms.  Over the past 3 weeks patient has had 3 different occasions with possible TIA symptoms.  The first episode was when he suddenly lost the ability to do addition and subtraction.  He states that typically is very good at math and could not perform basic math in his head.  The second episode he developed numbness that involved his left arm and left leg.  Most recently, yesterday he had an episode where he had numbness in his left arm and left leg that moved to his left face.  He did not have any facial droop or slurred speech.  He denies any weakness in the arm of the leg.  He denies any vision changes or loss of vision.  Patient has had no previous cardiac history or history of stroke.  Remote smoking history.  No history of high cholesterol or diabetes.  He is not anticoagulated.  Patient went to dialysis today and told staff there about his symptoms.  They immediately transferred him for the emergency department by EMS over concerns for TIA. The onset of this condition was acute. The course is resolved. Aggravating factors: none. Alleviating factors: none.        Past Medical History:  Diagnosis Date  . Anemia   . Chronic kidney disease, stage IV (severe) ([place])   . Dialysis patient [facility]) tues thurs sat  . Erectile dysfunction   . Head injury, closed, with concussion    x 2 in High School  . Hyperkalemia   . Hyperlipidemia   . Hypertension   . Proteinuria   . Renal tubular acidosis, type 1   . Secondary hyperparathyroidism of renal origin ([place])      There are no active problems to display for this patient.   Past Surgical History:  Procedure Laterality Date  . AV FISTULA PLACEMENT Left 03/09/2016   Procedure: RADIOCEPHALIC ARTERIOVENOUS (AV) FISTULA CREATION - LEFT;  Surgeon: Rosetta Posner, MD;  Location: [place];  Service: Vascular;  Laterality: Left;  . TONSILLECTOMY          Home Medications    Prior to Admission medications   Medication Sig Start Date End Date Taking? Authorizing Provider  acetaminophen (TYLENOL) 500 MG tablet Take 1,000 mg by mouth every 6 (six) hours as needed.    [provider]  amLODipine (NORVASC) 10 MG tablet Take 1 tablet by mouth daily. 01/17/16   [provider]  aspirin 81 MG tablet Take 81 mg by mouth daily. Reported on 01/13/2016    [provider]  atorvastatin (LIPITOR) 20 MG tablet Take 20 mg by mouth daily. Reported on 01/13/2016    [provider]  calcitRIOL (ROCALTROL) 0.25 MCG capsule Take 0.5 mcg by mouth daily. Reported on 01/13/2016    [provider]  furosemide (LASIX) 40 MG tablet Take 80 mg by mouth 2 (two) times daily. 02/15/16   [provider]  oxyCODONE-acetaminophen (ROXICET) 5-325 MG tablet Take 1 tablet by mouth every 6 (six) hours as needed. 03/09/16   Virgina Jock A, PA-C  sodium  bicarbonate 650 MG tablet Take 1,950 mg by mouth 2 (two) times daily. Reported on 01/13/2016    [provider]  tadalafil (CIALIS) 20 MG tablet Take 10 mg by mouth daily as needed for erectile dysfunction. Reported on 01/13/2016    [provider]    Family History Family History  Problem Relation Age of Onset  . Hypertension Father   . Diabetes Father     Social History Social History   Tobacco Use  . Smoking status: Former Smoker    Years: 15.00    Last attempt to quit: 07/18/1989    Years since quitting: 28.8  . Smokeless tobacco: Never Used  . Tobacco comment: 1/2 pack a day- when smoked  Substance Use Topics  .  Alcohol use: Yes    Alcohol/week: 14.0 standard drinks    Types: 7 Standard drinks or equivalent, 4 Cans of beer, 3 Glasses of wine per week  . Drug use: No     Allergies   Prednisone and Shrimp [shellfish allergy]   Review of Systems Review of Systems  Constitutional: Negative for fever.  HENT: Negative for rhinorrhea and sore throat.   Eyes: Negative for redness.  Respiratory: Negative for cough.   Cardiovascular: Negative for chest pain.  Gastrointestinal: Negative for abdominal pain, diarrhea, nausea and vomiting.  Genitourinary: Negative for dysuria.  Musculoskeletal: Negative for myalgias.  Skin: Negative for rash.  Neurological: Positive for numbness. Negative for facial asymmetry and headaches.  Psychiatric/Behavioral: Positive for confusion.     Physical Exam Updated Vital Signs BP (!) 171/83   Pulse 66   Temp (!) 97.5 F (36.4 C) (Oral)   Resp 18   Ht 5\' 6"  (1.676 m)   Wt 68 kg   SpO2 97%   BMI 24.21 kg/m   Physical Exam  Constitutional: He is oriented to person, place, and time. He appears well-developed and well-nourished.  HENT:  Head: Normocephalic and atraumatic.  Right Ear: Tympanic membrane, external ear and ear canal normal.  Left Ear: Tympanic membrane, external ear and ear canal normal.  Nose: Nose normal.  Mouth/Throat: Uvula is midline, oropharynx is clear and moist and mucous membranes are normal.  Eyes: Pupils are equal, round, and reactive to light. Conjunctivae, EOM and lids are normal.  Neck: Normal range of motion. Neck supple.  No carotid bruits.  Cardiovascular: Normal rate and regular rhythm.  Pulmonary/Chest: Effort normal and breath sounds normal.  Abdominal: Soft. There is no tenderness.  Musculoskeletal: Normal range of motion. He exhibits no edema or tenderness.       Cervical back: He exhibits normal range of motion, no tenderness and no bony tenderness.  Dialysis access left upper extremity.  Nurse at bedside deaccessing  fistula.  Neurological: He is alert and oriented to person, place, and time. He has normal strength and normal reflexes. No cranial nerve deficit or sensory deficit. He exhibits normal muscle tone. He displays a negative Romberg sign. Coordination and gait normal. GCS eye subscore is 4. GCS verbal subscore is 5. GCS motor subscore is 6.  Skin: Skin is warm and dry.  Psychiatric: He has a normal mood and affect.  Nursing note and vitals reviewed.    ED Treatments / Results  Labs (all labs ordered are listed, but only abnormal results are displayed) Labs Reviewed  APTT - Abnormal; Notable for the following components:      Result Value   aPTT 40 (*)    All other components within normal limits  CBC - Abnormal; Notable for the following components:   RBC 3.39 (*)    Hemoglobin 11.3 (*)    HCT 34.3 (*)    MCV 101.2 (*)    All other components within normal limits  DIFFERENTIAL - Abnormal; Notable for the following components:   Monocytes Absolute 1.1 (*)    All other components within normal limits  COMPREHENSIVE METABOLIC PANEL - Abnormal; Notable for the following components:   Potassium 5.7 (*)    Glucose, Bld 166 (*)    BUN 49 (*)    Creatinine, Ser 9.99 (*)    Total Protein 6.2 (*)    GFR calc non Af Amer 5 (*)    GFR calc Af Amer 5 (*)    All other components within normal limits  CBG MONITORING, ED - Abnormal; Notable for the following components:   Glucose-Capillary 152 (*)    All other components within normal limits  I-STAT CHEM 8, ED - Abnormal; Notable for the following components:   Potassium 5.5 (*)    BUN 48 (*)    Creatinine, Ser 10.60 (*)    Glucose, Bld 164 (*)    Calcium, Ion 1.11 (*)    Hemoglobin 11.2 (*)    HCT 33.0 (*)    All other components within normal limits  PROTIME-INR  I-STAT TROPONIN, ED    EKG EKG Interpretation  Date/Time:  Thursday May 03 2018 13:23:58 EDT Ventricular Rate:  75 PR Interval:    QRS Duration: 94 QT  Interval:  385 QTC Calculation: 430 R Axis:   -21 Text Interpretation:  Sinus rhythm Borderline left axis deviation Minimal ST depression, lateral leads poor baseline, similar to prior 8/17 Confirmed by Aletta Edouard [phone]) on 05/03/2018 1:33:35 PM Also confirmed by Aletta Edouard [phone]), editor Hattie Perch ([id number])  on 05/03/2018 2:02:25 PM   Radiology No results found.  Procedures Procedures (including critical care time)  Medications Ordered in ED Medications - No data to display   Initial Impression / Assessment and Plan / ED Course  I have reviewed the triage vital signs and the nursing notes.  Pertinent labs & imaging results that were available during my care of the patient were reviewed by me and considered in my medical decision making (see chart for details).  Clinical Course as of May 03 1449  Thu May 04, 9259  [id number] 72 year old male with end-stage renal disease Tuesday Thursday Saturday here with a few episodes over the last month or so of what sound like to be neurologic symptoms.  He said he had one episode where he is having some difficulty doing math.  More recently a week ago and then again yesterday he had some numbness in his left arm.  Yesterday was also associated with left face and left leg tingling.  All of his symptoms lasted minutes at a time and have fully resolved.  He is getting some lab work and we will discuss with neurology about will probably needed to be admitted for a TIA work-up.   [MB]    Clinical Course User Index [MB] Hayden Rasmussen, MD    Patient seen and examined. Work-up initiated. Pending labs, head CT.   Vital signs reviewed and are as follows: BP (!) 171/83   Pulse 66   Temp (!) 97.5 F (36.4 C) (Oral)   Resp 18   Ht 5\' 6"  (1.676 m)   Wt 68 kg   SpO2 97%   BMI 24.21 kg/m   Labs reviewed  prior to shift change. Pt updated. Pending head CT. Will need admit for TIA work-up.   Handoff to oncoming team at shift change.    Final Clinical Impressions(s) / ED Diagnoses   Final diagnoses:  Paresthesia  Hyperkalemia  ESRD (end stage renal disease) on dialysis [facility])    ED Discharge Orders    None       Carlisle Cater, PA-C 05/04/18 9794    Hayden Rasmussen, MD 05/04/18 1230

## 2018-05-03 NOTE — Consult Note (Addendum)
Neurology Consultation  Reason for Consult: stroke  Referring Physician: Marthenia Rolling  CC: left side numbness  History is obtained from patient.   HPI: Rodney Bridges is a 72 y.o. male with PMH significant for CKD stage IV on dialysis, Hypertension, Hyperlipidemia and secondary hyperparathyroidism of renal origin who presents today with complaints of intermittent left facial, arm and leg numbness along with headaches for the past month with the most recent episode occurring on yesterday morning. On yesterday morning, the patient reports that the symptoms were further complicated by noted left side facial drooping and LLE weakness, making it difficult to bear weight. Patient describes the numbness feeling as if his left side has been "injected with Novocaine," but states that it is unclear whether the headaches are correlating with the episodes of paresthesia and numbness.   Patient reports that he first noticed a cognitive change about a month ago, when he was unable perform mathematical equations during preparation of bills, which is something that typically "comes easy" to him. From that day to present, he has noticed the intermittent left sided numbness and frequent headaches which has tallied up to be a total of 3 episodes. Patient reports that currently he has decreased sensation to the left face, arm, and leg; however, he denies any headache, vision changes or weakness at this time.      ED course: Patient was sent to the ED from Dialysis due to stroke-like symptoms. Patient did not receive any dialysis treatment today. Upon ED arrival pt was A+Ox4, hypertensive and hyperkalemic. Due to the presentation of clinical symptoms of suspected stroke, Neurology was consulted.   LKW: September 2019  tpa given?: no,  Premorbid modified Rankin scale (mRS): 1  ROS: A 14 point ROS was performed and is negative except as noted in the HPI.  Past Medical History:  Diagnosis Date  . Anemia   . Chronic  kidney disease, stage IV (severe) ([place])   . Dialysis patient [facility]) tues thurs sat  . Erectile dysfunction   . Head injury, closed, with concussion    x 2 in High School  . Hyperkalemia   . Hyperlipidemia   . Hypertension   . Proteinuria   . Renal tubular acidosis, type 1   . Secondary hyperparathyroidism of renal origin ([place])     1-No significant post stroke disability and can perform usual duties with stroke symptoms Essential (primary) hypertension and CKD Stage 4 (GFR 15-29)   Family History  Problem Relation Age of Onset  . Hypertension Father   . Diabetes Father      Social History:   reports that he quit smoking about 28 years ago. He quit after 15.00 years of use. He has never used smokeless tobacco. He reports that he drinks about 14.0 standard drinks of alcohol per week. He reports that he does not use drugs.  Medications  Current Facility-Administered Medications:  .  [START ON 05/04/2018] Chlorhexidine Gluconate Cloth 2 % PADS 6 each, 6 each, Topical, Q0600, Elmarie Shiley, MD .  Derrill Memo ON 05/05/2018] doxercalciferol (HECTOROL) injection 2 mcg, 2 mcg, Intravenous, Q T,Th,Sa-HD, Elmarie Shiley, MD  Current Outpatient Medications:  .  acetaminophen (TYLENOL) 500 MG tablet, Take 1,000 mg by mouth every 6 (six) hours as needed., Disp: , Rfl:  .  allopurinol (ZYLOPRIM) 100 MG tablet, Take 100 mg by mouth daily., Disp: , Rfl: 5 .  amLODipine (NORVASC) 10 MG tablet, Take 1 tablet by mouth daily., Disp: , Rfl: 1 .  aspirin 81 MG tablet,  Take 81 mg by mouth daily. Reported on 01/13/2016, Disp: , Rfl:  .  atorvastatin (LIPITOR) 20 MG tablet, Take 20 mg by mouth daily. Reported on 01/13/2016, Disp: , Rfl:  .  calcitRIOL (ROCALTROL) 0.25 MCG capsule, Take 0.5 mcg by mouth daily. Reported on 01/13/2016, Disp: , Rfl:  .  carvedilol (COREG) 12.5 MG tablet, Take 12.5 mg by mouth 2 (two) times daily. as directed, Disp: , Rfl: 1 .  furosemide (LASIX) 40 MG tablet, Take 80 mg by mouth 2 (two)  times daily., Disp: , Rfl: 0 .  oxyCODONE-acetaminophen (ROXICET) 5-325 MG tablet, Take 1 tablet by mouth every 6 (six) hours as needed., Disp: 6 tablet, Rfl: 0 .  sodium bicarbonate 650 MG tablet, Take 1,950 mg by mouth 2 (two) times daily. Reported on 01/13/2016, Disp: , Rfl:  .  tadalafil (CIALIS) 20 MG tablet, Take 10 mg by mouth daily as needed for erectile dysfunction. Reported on 01/13/2016, Disp: , Rfl:    Exam: Current vital signs: BP (!) 180/92   Pulse 73   Temp (!) 97.5 F (36.4 C) (Oral)   Resp 18   Ht 5\' 6"  (1.676 m)   Wt 68 kg   SpO2 94%   BMI 24.21 kg/m  Vital signs in last 24 hours: Temp:  [97.5 F (36.4 C)] 97.5 F (36.4 C) (10/17 1324) Pulse Rate:  [66-74] 73 (10/17 1700) Resp:  [11-20] 18 (10/17 1700) BP: (168-191)/(80-97) 180/92 (10/17 1700) SpO2:  [93 %-100 %] 94 % (10/17 1700) Weight:  [68 kg] 68 kg (10/17 1325)  Physical Exam  Constitutional: Appears well-developed and well-nourished.  HEENT: [place]/AT Lungs: Respirations unlabored Ext: Warm and well perfused  Neuro: Mental Status: Patient is awake, alert, oriented to person, place, month, year, and situation. Patient is able to give a clear and coherent history. No signs of aphasia or neglect.  No dyscalculia noted.  Good eye contact, pleasant and cooperative. Good insight.  Cranial Nerves: II: Visual Fields are full. PERRL III,IV, VI: EOMI without ptosis or diplopia. No nystagmus.  V: Decreased temperature sensation on the left.  VII: Facial movement is symmetric.  VIII: hearing is intact to voice X: No hypophonia or hoarseness XI: Head is midline. XII: tongue is midline Motor: Tone is normal. Bulk is normal. 5/5 strength was present in all four extremities.  No pronator drift.  Sensory: Decreased temp sensation to LUE. No subjective difference of temp sensation to lower ext bilaterally. FT intact without extinction to DSS.  Deep Tendon Reflexes:  1+ and symmetric in the brachioradialis,  biceps and patellae bilaterally.  Plantars: Toes are downgoing bilaterally.  Cerebellar: FNF intact bilaterally without ataxia. Mild tremor is noted with arms outstretched, slightly worse on the right.   Labs I have reviewed labs in epic and the results pertinent to this consultation are:  CBC    Component Value Date/Time   WBC 9.7 05/03/2018 1335   RBC 3.39 (L) 05/03/2018 1335   HGB 11.2 (L) 05/03/2018 1346   HCT 33.0 (L) 05/03/2018 1346   PLT 169 05/03/2018 1335   MCV 101.2 (H) 05/03/2018 1335   MCH 33.3 05/03/2018 1335   MCHC 32.9 05/03/2018 1335   RDW 14.4 05/03/2018 1335   LYMPHSABS 1.8 05/03/2018 1335   MONOABS 1.1 (H) 05/03/2018 1335   EOSABS 0.4 05/03/2018 1335   BASOSABS 0.1 05/03/2018 1335    CMP     Component Value Date/Time   NA 139 05/03/2018 1346   K 5.5 (H) 05/03/2018 1346  CL 100 05/03/2018 1346   CO2 28 05/03/2018 1335   GLUCOSE 164 (H) 05/03/2018 1346   BUN 48 (H) 05/03/2018 1346   CREATININE 10.60 (H) 05/03/2018 1346   CALCIUM 9.8 05/03/2018 1335   PROT 6.2 (L) 05/03/2018 1335   ALBUMIN 3.9 05/03/2018 1335   AST 25 05/03/2018 1335   ALT 20 05/03/2018 1335   ALKPHOS 53 05/03/2018 1335   BILITOT 0.9 05/03/2018 1335   GFRNONAA 5 (L) 05/03/2018 1335   GFRAA 5 (L) 05/03/2018 1335    Lipid Panel  No results found for: CHOL, TRIG, HDL, CHOLHDL, VLDL, LDLCALC, LDLDIRECT   Imaging I have reviewed the images obtained:  CT-scan of the brain: Mild generalized atrophy. Normal ventricular morphology. No midline shift or mass effect. Otherwise normal appearance of brain parenchyma. Mild atherosclerotic calcifications noted.   Assessment: 72 year old male with 3 episodes of left sided paresthesia and sensory numbness. The last episode was accompanied by LLE weakness 1. DDx includes TIA, small lacunar stroke and complicated migraine. His symptoms are not consistent with seizure.  2. Stroke risk factors: HLD, HTN  Recommendations: 1. MRI brain 2. MRA of  head 3. Carotid ultrasound 4. Cardiac telemetry 5. TTE 5. If MRI is positive, increase ASA to 325 mg po qd, or switch to Plavix.   I have seen and examined the patient. I have formulated the assessment and plan and documented the neurological examination findings.  Electronically signed: Dr. Kerney Elbe 05/03/2018, 5:59 PM

## 2018-05-03 NOTE — ED Triage Notes (Signed)
Pt from HD to ER via [facility], did not start HD today due to stroke like s/s recently reported. Yest am pt noticed some L arm numbness followed by L leg and L face numbness and droopiness. Sx lasted 5 min and self resolved per patient report. Pt states this happened a week ago as well. NO current s/s or complaints, negative stroke screen. Hx htn that has been uncontrolled for 3 mo. 12 lead unremarkable, VSS with [facility].

## 2018-05-03 NOTE — ED Provider Notes (Signed)
Patient with history of ESRD on HD TTS presenting to the ED for multiple episodes of left-sided numbness, most recently yesterday. Did not receive HD today due to these symptoms. No neuro history. Initial labs show K 5.5, o/w reassuring. Awaiting CT head, plan to admit to medicine for TIA w/u if negative.  ED Course CT had showed no acute findings.  Patient evaluated bedside.  HPI reviewed.  Remains asymptomatic.  Case discussed with hospitalist who accepted patient for admission.  Nephrology consulted for inpatient HD.  Admitted in stable condition.  Clinical Impression: 1. Paresthesia   2. Hyperkalemia   3. ESRD (end stage renal disease) on dialysis [facility])      Prescilla Sours, MD 05/03/18 1744    Isla Pence, MD 05/03/18 [phone]

## 2018-05-03 NOTE — H&P (Signed)
History and Physical  Rodney Bridges LSL:[id number] DOB: 1946-02-02 DOA: 05/03/2018  Referring physician: ER physician PCP: Shirline Frees, MD  Outpatient Specialists: Nephrology Patient coming from: Home  Chief Complaint: Left-sided numbness  HPI: Patient is a 72 year old male with past medical history significant for end-stage renal disease on hemodialysis on Tuesday, Thursday and Saturday; secondary hyperparathyroidism, type I renal tubular acidosis, proteinuria, hypertension, hyperlipidemia, head injury and history of hyperkalemia.  Patient presents with a 2-week history of intermittent left-sided numbness.  Patient describes numbness involving the left upper extremity, left lower extremity and left side of the face.  Numbness is said to be intermittent and would normally last for about 5 minutes.  Patient was on aspirin but stopped taking aspirin about 2 to 4 weeks ago.  No headache, no neck pain, no chest pain, no fever or chills, no GI symptoms and no urinary symptoms.  Systolic blood pressure is noted to be around 180 mmHg.  Patient's potassium is 5.5 today, but today's patient's hemodialysis today.  ED Course: The ER provider called the hospitalist team to admit patient.  Pertinent labs: Sodium is 139, potassium of 5.5, chloride 100, CO2 of 20, BUN of 48 with creatinine of 10.6.  Ionized calcium is 1.11.  CBC reveals WBC of 9.7, hemoglobin of 11.3, hematocrit of 34.3, MCV of 101.2 with platelet count of 169.  CT head without contrast has not revealed any acute abnormality.  EKG: Independently reviewed.   Imaging: independently reviewed.   Review of Systems:  Negative for fever, visual changes, sore throat, rash, new muscle aches, chest pain, SOB, dysuria, bleeding, n/v/abdominal pain.  Past Medical History:  Diagnosis Date  . Anemia   . Chronic kidney disease, stage IV (severe) ([place])   . Dialysis patient [facility]) tues thurs sat  . Erectile dysfunction   . Head injury, closed,  with concussion    x 2 in High School  . Hyperkalemia   . Hyperlipidemia   . Hypertension   . Proteinuria   . Renal tubular acidosis, type 1   . Secondary hyperparathyroidism of renal origin [facility])     Past Surgical History:  Procedure Laterality Date  . AV FISTULA PLACEMENT Left 03/09/2016   Procedure: RADIOCEPHALIC ARTERIOVENOUS (AV) FISTULA CREATION - LEFT;  Surgeon: Rosetta Posner, MD;  Location: [place];  Service: Vascular;  Laterality: Left;  . TONSILLECTOMY       reports that he quit smoking about 28 years ago. He quit after 15.00 years of use. He has never used smokeless tobacco. He reports that he drinks about 14.0 standard drinks of alcohol per week. He reports that he does not use drugs.  Allergies  Allergen Reactions  . Bee Venom Swelling    Swells badly with bee stings (WELTS at site and lightheadedness)  . Prednisone     Difficulty swallowing/reported by patient  . Shrimp [Shellfish Allergy] Rash    Family History  Problem Relation Age of Onset  . Hypertension Father   . Diabetes Father      Prior to Admission medications   Medication Sig Start Date End Date Taking? Authorizing Provider  acetaminophen (TYLENOL) 500 MG tablet Take 1,000 mg by mouth every 6 (six) hours as needed.    [provider]  allopurinol (ZYLOPRIM) 100 MG tablet Take 100 mg by mouth daily. 04/20/18   [provider]  amLODipine (NORVASC) 10 MG tablet Take 1 tablet by mouth daily. 01/17/16   [provider]  aspirin 81 MG tablet Take 81  mg by mouth daily. Reported on 01/13/2016    [provider]  atorvastatin (LIPITOR) 20 MG tablet Take 20 mg by mouth daily. Reported on 01/13/2016    [provider]  calcitRIOL (ROCALTROL) 0.25 MCG capsule Take 0.5 mcg by mouth daily. Reported on 01/13/2016    [provider]  carvedilol (COREG) 12.5 MG tablet Take 12.5 mg by mouth 2 (two) times daily. as directed 04/24/18   [provider]  furosemide  (LASIX) 40 MG tablet Take 80 mg by mouth 2 (two) times daily. 02/15/16   [provider]  oxyCODONE-acetaminophen (ROXICET) 5-325 MG tablet Take 1 tablet by mouth every 6 (six) hours as needed. 03/09/16   Alvia Grove, PA-C  sodium bicarbonate 650 MG tablet Take 1,950 mg by mouth 2 (two) times daily. Reported on 01/13/2016    [provider]  tadalafil (CIALIS) 20 MG tablet Take 10 mg by mouth daily as needed for erectile dysfunction. Reported on 01/13/2016    [provider]    Physical Exam: Vitals:   05/03/18 1325 05/03/18 1400 05/03/18 1415 05/03/18 1430  BP:  (!) 191/95 (!) 189/90 (!) 171/83  Pulse:  71 73 66  Resp:  17 11 18   Temp:      TempSrc:      SpO2:  99% 100% 97%  Weight: 68 kg     Height: 5\' 6"  (1.676 m)       Constitutional:  . Appears calm and comfortable Eyes:  . No pallor. No jaundice.  ENMT:  . external ears, nose appear normal Neck:  . Neck is supple. No JVD Respiratory:  . CTA bilaterally, no w/r/r.  . Respiratory effort normal. No retractions or accessory muscle use Cardiovascular:  . S1S2 . No LE extremity edema   Abdomen:  . Abdomen is soft and non tender. Organs are difficult to assess. Neurologic:  . Awake and alert. . Moves all limbs.  Wt Readings from Last 3 Encounters:  05/03/18 68 kg  04/05/16 72.3 kg  03/09/16 72.6 kg    I have personally reviewed following labs and imaging studies  Labs on Admission:  CBC: Recent Labs  Lab 05/03/18 1335 05/03/18 1346  WBC 9.7  --   NEUTROABS 6.4  --   HGB 11.3* 11.2*  HCT 34.3* 33.0*  MCV 101.2*  --   PLT 169  --    Basic Metabolic Panel: Recent Labs  Lab 05/03/18 1335 05/03/18 1346  NA 142 139  K 5.7* 5.5*  CL 100 100  CO2 28  --   GLUCOSE 166* 164*  BUN 49* 48*  CREATININE 9.99* 10.60*  CALCIUM 9.8  --    Liver Function Tests: Recent Labs  Lab 05/03/18 1335  AST 25  ALT 20  ALKPHOS 53  BILITOT 0.9  PROT 6.2*  ALBUMIN 3.9   No results for  input(s): LIPASE, AMYLASE in the last 168 hours. No results for input(s): AMMONIA in the last 168 hours. Coagulation Profile: Recent Labs  Lab 05/03/18 1335  INR 1.04   Cardiac Enzymes: No results for input(s): CKTOTAL, CKMB, CKMBINDEX, TROPONINI in the last 168 hours. BNP (last 3 results) No results for input(s): PROBNP in the last 8760 hours. HbA1C: No results for input(s): HGBA1C in the last 72 hours. CBG: Recent Labs  Lab 05/03/18 1342  GLUCAP 152*   Lipid Profile: No results for input(s): CHOL, HDL, LDLCALC, TRIG, CHOLHDL, LDLDIRECT in the last 72 hours. Thyroid Function Tests: No results for input(s):  TSH, T4TOTAL, FREET4, T3FREE, THYROIDAB in the last 72 hours. Anemia Panel: No results for input(s): VITAMINB12, FOLATE, FERRITIN, TIBC, IRON, RETICCTPCT in the last 72 hours. Urine analysis: No results found for: COLORURINE, APPEARANCEUR, LABSPEC, PHURINE, GLUCOSEU, HGBUR, BILIRUBINUR, KETONESUR, PROTEINUR, UROBILINOGEN, NITRITE, LEUKOCYTESUR Sepsis Labs: @LABRCNTIP (procalcitonin:4,lacticidven:4) )No results found for this or any previous visit (from the past 240 hour(s)).    Radiological Exams on Admission: Ct Head Wo Contrast  Result Date: 05/03/2018 CLINICAL DATA:  TIA, tingling from arms to fingers on 1 episode with with another episode of tingling in the face, history chronic kidney disease on dialysis, hypertension, former smoker EXAM: CT HEAD WITHOUT CONTRAST TECHNIQUE: Contiguous axial images were obtained from the base of the skull through the vertex without intravenous contrast. Sagittal and coronal MPR images reconstructed from axial data set. COMPARISON:  None FINDINGS: Brain: Mild generalized atrophy. Normal ventricular morphology. No midline shift or mass effect. Otherwise normal appearance of brain parenchyma. No intracranial hemorrhage, mass lesion or evidence of acute infarction. No extra-axial fluid collections. Vascular: Mild atherosclerotic calcification  of internal carotid arteries bilaterally at skull base Skull: Intact Sinuses/Orbits: Clear Other: N/A IMPRESSION: No acute intracranial abnormalities. Electronically Signed   By: Lavonia Dana M.D.   On: 05/03/2018 16:15    EKG: Independently reviewed.   Active Problems:   * No active hospital problems. *   Assessment/Plan Possible TIA: MRI of the brain (rule out thalamic infarct) MRI of the brain and neck Echocardiogram HbA1c Urinalysis Lipid profile Neurology input.  ESRD on hemodialysis: Patient is supposed to go for hemodialysis today. Potassium is 5.5. ER provider will contact the nephrology team.  Hypertension: Currently systolic blood pressure is 180 mmHg. Very cautious control as patient may have thalamic infarct. Follow MRI brain. Further management depend on hospital course.  Hyperlipidemia: Lipid profile Continue Lipitor  DVT prophylaxis: Subcu heparin Code Status: Full code Family Communication:  Disposition Plan: Home eventually Consults called: Neurology Admission status: Observation  Time spent: 60 minutes minutes  Dana Allan, MD  [facility] Pager #: [phone] 7PM-7AM contact night coverage as above  05/03/2018, 4:57 PM

## 2018-05-03 NOTE — Consult Note (Addendum)
Reason for Consult: Hyperkalemia, continuity of ESRD care Referring Physician: Kyra Searles M.D. [facility])  HPI:  72 year old Caucasian man with past medical history significant for end-stage renal disease who has been on hemodialysis on a TTS schedule for the past 1-1/2 years.  He has underlying hypertension as well as the comorbidities of anemia and secondary hyperparathyroidism.  Presents with complaints of left-sided arm numbness as well as facial droop that have resolved since arrival to the emergency room.  He apparently had similar symptoms but not to this severity about 2 and 4 weeks ago when symptoms did not involve face.  He denies any chest pain or shortness of breath and does not have any nausea, vomiting, diarrhea or slurred speech.  He denies any critical intradialytic hypotensive episodes and has not had any orthostatic dizziness or orthopnea.  He denies any visual changes and does not suffer migraine type headaches.  Dialysis prescription: TTS, [facility], 4 hours, 180 dialyzer, BFR 400/DFR 800, EDW 69 kg, 2K/2.0 calcium, no UF profile, no sodium modeling.  Left radiocephalic fistula.  Heparin 1800 unit bolus, Hectorol 2 mcg IV q. HD, Venofer 100 mg IV q. HD ongoing.  Past Medical History:  Diagnosis Date  . Anemia   . Chronic kidney disease, stage IV (severe) ([place])   . Dialysis patient [facility]) tues thurs sat  . Erectile dysfunction   . Head injury, closed, with concussion    x 2 in High School  . Hyperkalemia   . Hyperlipidemia   . Hypertension   . Proteinuria   . Renal tubular acidosis, type 1   . Secondary hyperparathyroidism of renal origin [facility])     Past Surgical History:  Procedure Laterality Date  . AV FISTULA PLACEMENT Left 03/09/2016   Procedure: RADIOCEPHALIC ARTERIOVENOUS (AV) FISTULA CREATION - LEFT;  Surgeon: Rosetta Posner, MD;  Location: [place];  Service: Vascular;  Laterality: Left;  . TONSILLECTOMY      Family History  Problem Relation  Age of Onset  . Hypertension Father   . Diabetes Father     Social History:  reports that he quit smoking about 28 years ago. He quit after 15.00 years of use. He has never used smokeless tobacco. He reports that he drinks about 14.0 standard drinks of alcohol per week. He reports that he does not use drugs.  Allergies:  Allergies  Allergen Reactions  . Bee Venom Swelling    Swells badly with bee stings (WELTS at site and lightheadedness)  . Prednisone     Difficulty swallowing/reported by patient  . Shrimp [Shellfish Allergy] Rash    Medications: Reviewed Scheduled: . [START ON 05/04/2018] Chlorhexidine Gluconate Cloth  6 each Topical Q0600  . [START ON 05/05/2018] doxercalciferol  2 mcg Intravenous Q T,Th,Sa-HD    BMP Latest Ref Rng & Units 05/03/2018 05/03/2018 03/09/2016  Glucose 70 - 99 mg/dL 164(H) 166(H) 116(H)  BUN 8 - 23 mg/dL 48(H) 49(H) -  Creatinine 0.61 - 1.24 mg/dL 10.60(H) 9.99(H) -  Sodium 135 - 145 mmol/L 139 142 143  Potassium 3.5 - 5.1 mmol/L 5.5(H) 5.7(H) 3.7  Chloride 98 - 111 mmol/L 100 100 -  CO2 22 - 32 mmol/L - 28 -  Calcium 8.9 - 10.3 mg/dL - 9.8 -   CBC Latest Ref Rng & Units 05/03/2018 05/03/2018 03/09/2016  WBC 4.0 - 10.5 K/uL - 9.7 -  Hemoglobin 13.0 - 17.0 g/dL 11.2(L) 11.3(L) 10.9(L)  Hematocrit 39.0 - 52.0 % 33.0(L) 34.3(L) 32.0(L)  Platelets 150 -  400 K/uL - 169 -    Ct Head Wo Contrast  Result Date: 05/03/2018 CLINICAL DATA:  TIA, tingling from arms to fingers on 1 episode with with another episode of tingling in the face, history chronic kidney disease on dialysis, hypertension, former smoker EXAM: CT HEAD WITHOUT CONTRAST TECHNIQUE: Contiguous axial images were obtained from the base of the skull through the vertex without intravenous contrast. Sagittal and coronal MPR images reconstructed from axial data set. COMPARISON:  None FINDINGS: Brain: Mild generalized atrophy. Normal ventricular morphology. No midline shift or mass effect.  Otherwise normal appearance of brain parenchyma. No intracranial hemorrhage, mass lesion or evidence of acute infarction. No extra-axial fluid collections. Vascular: Mild atherosclerotic calcification of internal carotid arteries bilaterally at skull base Skull: Intact Sinuses/Orbits: Clear Other: N/A IMPRESSION: No acute intracranial abnormalities. Electronically Signed   By: Lavonia Dana M.D.   On: 05/03/2018 16:15    Review of Systems  Constitutional: Negative.   HENT: Negative.   Eyes: Negative.   Respiratory: Negative.   Cardiovascular: Negative.   Gastrointestinal: Negative.   Genitourinary: Negative.   Musculoskeletal: Negative.   Skin: Negative.   Neurological: Positive for sensory change and focal weakness.       Transient sensation of paresthesia and numbness left arm as well as left-sided facial droop earlier today   Blood pressure (!) 180/92, pulse 73, temperature (!) 97.5 F (36.4 C), temperature source Oral, resp. rate 18, height 5\' 6"  (1.676 m), weight 68 kg, SpO2 94 %. Physical Exam  Nursing note and vitals reviewed. Constitutional: He is oriented to person, place, and time. He appears well-developed and well-nourished. No distress.  HENT:  Head: Normocephalic and atraumatic.  Mouth/Throat: Oropharynx is clear and moist.  Eyes: Pupils are equal, round, and reactive to light. Conjunctivae and EOM are normal. No scleral icterus.  Neck: Normal range of motion. Neck supple. No JVD present.  Cardiovascular: Normal rate, regular rhythm and normal heart sounds.  Respiratory: Effort normal and breath sounds normal. He has no wheezes. He has no rales.  GI: Soft. Bowel sounds are normal. There is no tenderness. There is no rebound and no guarding.  Musculoskeletal: He exhibits no edema.  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  Skin: Skin is warm and dry. No rash noted.   Psychiatric: He has a normal mood and affect.    Assessment/Plan: 1.  TIA symptoms: Plans noted for admission to the hospitalist service for overnight evaluation/monitoring and identification of risk factors for CVA prevention. 2.  End-stage renal disease: He missed his hemodialysis earlier today because of developing a TIA symptoms and will be dialyzed tonight specifically for his hyperkalemia. 3.  Hypertension: Resume antihypertensive therapy and monitor with ultrafiltration at dialysis. 4.  Secondary hyperparathyroidism: Resume renal diet, phosphorus binders and will begin Hectorol with hemodialysis for PTH suppression. 5.  Anemia of chronic kidney disease: Hemoglobin/hematocrit currently at goal, hold ESA.  Nasir Bright K. 05/03/2018, 5:36 PM

## 2018-05-04 ENCOUNTER — Observation Stay (HOSPITAL_BASED_OUTPATIENT_CLINIC_OR_DEPARTMENT_OTHER): Payer: Medicare Other

## 2018-05-04 DIAGNOSIS — I340 Nonrheumatic mitral (valve) insufficiency: Secondary | ICD-10-CM

## 2018-05-04 DIAGNOSIS — Z992 Dependence on renal dialysis: Secondary | ICD-10-CM | POA: Diagnosis not present

## 2018-05-04 DIAGNOSIS — N186 End stage renal disease: Secondary | ICD-10-CM | POA: Diagnosis not present

## 2018-05-04 DIAGNOSIS — E875 Hyperkalemia: Secondary | ICD-10-CM | POA: Diagnosis not present

## 2018-05-04 DIAGNOSIS — G459 Transient cerebral ischemic attack, unspecified: Secondary | ICD-10-CM | POA: Diagnosis not present

## 2018-05-04 LAB — RENAL FUNCTION PANEL
Albumin: 3.3 g/dL — ABNORMAL LOW (ref 3.5–5.0)
Anion gap: 13 (ref 5–15)
BUN: 58 mg/dL — AB (ref 8–23)
CHLORIDE: 101 mmol/L (ref 98–111)
CO2: 26 mmol/L (ref 22–32)
CREATININE: 11.09 mg/dL — AB (ref 0.61–1.24)
Calcium: 9.1 mg/dL (ref 8.9–10.3)
GFR calc non Af Amer: 4 mL/min — ABNORMAL LOW (ref >60)
GFR, EST AFRICAN AMERICAN: 5 mL/min — AB (ref >60)
Glucose, Bld: 92 mg/dL (ref 70–99)
POTASSIUM: 6.2 mmol/L — AB (ref 3.5–5.1)
Phosphorus: 6.6 mg/dL — ABNORMAL HIGH (ref 2.5–4.6)
Sodium: 140 mmol/L (ref 135–145)

## 2018-05-04 LAB — ECHOCARDIOGRAM COMPLETE
Height: 66 in
Weight: 2433.88 oz

## 2018-05-04 LAB — CBC
HEMATOCRIT: 30.7 % — AB (ref 39.0–52.0)
Hemoglobin: 9.8 g/dL — ABNORMAL LOW (ref 13.0–17.0)
MCH: 32.2 pg (ref 26.0–34.0)
MCHC: 31.9 g/dL (ref 30.0–36.0)
MCV: 101 fL — AB (ref 80.0–100.0)
PLATELETS: 153 10*3/uL (ref 150–400)
RBC: 3.04 MIL/uL — ABNORMAL LOW (ref 4.22–5.81)
RDW: 14.6 % (ref 11.5–15.5)
WBC: 8.8 10*3/uL (ref 4.0–10.5)
nRBC: 0 % (ref 0.0–0.2)

## 2018-05-04 MED ORDER — CALCITRIOL 0.25 MCG PO CAPS: 0.5000 ug | ORAL_CAPSULE | Freq: Every day | ORAL | Status: AC

## 2018-05-04 MED ORDER — LORAZEPAM 1 MG PO TABS: 1.0000 mg | ORAL_TABLET | Freq: Four times a day (QID) | ORAL | Status: DC | PRN

## 2018-05-04 MED ORDER — FOLIC ACID 1 MG PO TABS
1.0000 mg | ORAL_TABLET | Freq: Every day | ORAL | Status: DC
  Administered 2018-05-04: 1 mg via ORAL
  Filled 2018-05-04: qty 1

## 2018-05-04 MED ORDER — LORAZEPAM 2 MG/ML IJ SOLN: 1.0000 mg | Freq: Four times a day (QID) | INTRAMUSCULAR | Status: DC | PRN

## 2018-05-04 MED ORDER — THIAMINE HCL 100 MG/ML IJ SOLN: 100.0000 mg | Freq: Every day | INTRAMUSCULAR | Status: DC

## 2018-05-04 MED ORDER — SODIUM BICARBONATE 650 MG PO TABS: 1950.0000 mg | ORAL_TABLET | Freq: Two times a day (BID) | ORAL | Status: AC

## 2018-05-04 MED ORDER — SEVELAMER CARBONATE 800 MG PO TABS
2400.0000 mg | ORAL_TABLET | Freq: Three times a day (TID) | ORAL | Status: DC
  Administered 2018-05-04: 2400 mg via ORAL
  Filled 2018-05-04: qty 3

## 2018-05-04 MED ORDER — ASPIRIN 325 MG PO TABS: 325.0000 mg | ORAL_TABLET | Freq: Every day | ORAL | Status: AC

## 2018-05-04 MED ORDER — LOSARTAN POTASSIUM 25 MG PO TABS: 25.0000 mg | ORAL_TABLET | Freq: Every day | ORAL | Status: DC

## 2018-05-04 MED ORDER — LISINOPRIL 10 MG PO TABS: 10.0000 mg | ORAL_TABLET | Freq: Every day | ORAL | 0 refills | Status: DC

## 2018-05-04 MED ORDER — RENA-VITE PO TABS
1.0000 | ORAL_TABLET | Freq: Every day | ORAL | Status: DC
  Filled 2018-05-04: qty 1

## 2018-05-04 MED ORDER — LISINOPRIL 10 MG PO TABS: 10.0000 mg | ORAL_TABLET | Freq: Every day | ORAL | Status: DC

## 2018-05-04 MED ORDER — AMLODIPINE BESYLATE 10 MG PO TABS: 10.0000 mg | ORAL_TABLET | Freq: Every day | ORAL | Status: DC

## 2018-05-04 MED ORDER — VITAMIN B-1 100 MG PO TABS
100.0000 mg | ORAL_TABLET | Freq: Every day | ORAL | Status: DC
  Administered 2018-05-04: 100 mg via ORAL
  Filled 2018-05-04: qty 1

## 2018-05-04 MED ORDER — SEVELAMER CARBONATE 800 MG PO TABS: 800.0000 mg | ORAL_TABLET | ORAL | Status: DC | PRN

## 2018-05-04 MED ORDER — LOSARTAN POTASSIUM 25 MG PO TABS: 25.0000 mg | ORAL_TABLET | Freq: Every day | ORAL | 0 refills | Status: AC

## 2018-05-04 NOTE — Progress Notes (Signed)
OT Cancellation Note  Patient Details Name: Taahir Grisby MRN: [id number] DOB: 1946-04-23   Cancelled Treatment:    Reason Eval/Treat Not Completed: OT screened, no needs identified, will sign off.  Pt is back to baseline, and is independent per PT.   Imaging negative for acute infarct.    Lucille Passy, OTR/L Acute Rehabilitation Services Pager [phone] Office [phone]   Lucille Passy M 05/04/2018, 10:11 AM

## 2018-05-04 NOTE — Progress Notes (Signed)
HD tx initiated via 15Gx2 w/o problem, pull/push/flush well w/o problem, VSS, will cont to monitor while on HD tx 

## 2018-05-04 NOTE — Progress Notes (Signed)
Dr. Posey Pronto made aware of pre tx K+ = 6.2

## 2018-05-04 NOTE — Evaluation (Signed)
Physical Therapy Evaluation Patient Details Name: Rodney Bridges MRN: [id number] DOB: 1945-07-27 Today's Date: 05/04/2018   History of Present Illness  Pt is a 72 y.o. male with PMH significant for CKD stage IV on dialysis, Hypertension, Hyperlipidemia and secondary hyperparathyroidism of renal origin. He presents with complaints of intermittent left facial, arm and leg numbness along with headaches for the past month with the most recent episode occurring on 05/02/18. MRI negative.     Clinical Impression  PT eval complete. Pt is independent with all functional mobility. He scored 24/24 on DGI balance assessment. Pt reports all symptoms have resolved at this time. He was educated on BEFAST acronym and the importance of recognizing the early signs of a stroke. No further PT intervention indicated. PT signing off.    Follow Up Recommendations No PT follow up    Equipment Recommendations  None recommended by PT    Recommendations for Other Services       Precautions / Restrictions Precautions Precautions: None      Mobility  Bed Mobility Overal bed mobility: Independent                Transfers Overall transfer level: Independent                  Ambulation/Gait Ambulation/Gait assistance: Independent Gait Distance (Feet): 300 Feet Assistive device: None Gait Pattern/deviations: WFL(Within Functional Limits)   Gait velocity interpretation: >4.37 ft/sec, indicative of normal walking speed    Stairs Stairs: Yes Stairs assistance: Independent Stair Management: No rails;Forwards;Alternating pattern Number of Stairs: 12    Wheelchair Mobility    Modified Rankin (Stroke Patients Only)       Balance Overall balance assessment: Independent                               Standardized Balance Assessment Standardized Balance Assessment : Dynamic Gait Index   Dynamic Gait Index Level Surface: Normal Change in Gait Speed: Normal Gait  with Horizontal Head Turns: Normal Gait with Vertical Head Turns: Normal Gait and Pivot Turn: Normal Step Over Obstacle: Normal Step Around Obstacles: Normal Steps: Normal Total Score: 24       Pertinent Vitals/Pain Pain Assessment: No/denies pain    Home Living Family/patient expects to be discharged to:: Private residence Living Arrangements: Spouse/significant other Available Help at Discharge: Family;Available 24 hours/day Type of Home: House Home Access: Level entry     Home Layout: Two level;Able to live on main level with bedroom/bathroom Home Equipment: None      Prior Function Level of Independence: Independent               Hand Dominance        Extremity/Trunk Assessment   Upper Extremity Assessment Upper Extremity Assessment: Overall WFL for tasks assessed    Lower Extremity Assessment Lower Extremity Assessment: Overall WFL for tasks assessed    Cervical / Trunk Assessment Cervical / Trunk Assessment: Normal  Communication   Communication: No difficulties  Cognition Arousal/Alertness: Awake/alert Behavior During Therapy: WFL for tasks assessed/performed Overall Cognitive Status: Within Functional Limits for tasks assessed                                        General Comments      Exercises     Assessment/Plan    PT Assessment Patent does not  need any further PT services  PT Problem List         PT Treatment Interventions      PT Goals (Current goals can be found in the Care Plan section)  Acute Rehab PT Goals Patient Stated Goal: home PT Goal Formulation: All assessment and education complete, DC therapy    Frequency     Barriers to discharge        Co-evaluation               AM-PAC PT "6 Clicks" Daily Activity  Outcome Measure Difficulty turning over in bed (including adjusting bedclothes, sheets and blankets)?: None Difficulty moving from lying on back to sitting on the side of the bed? :  None Difficulty sitting down on and standing up from a chair with arms (e.g., wheelchair, bedside commode, etc,.)?: None Help needed moving to and from a bed to chair (including a wheelchair)?: None Help needed walking in hospital room?: None Help needed climbing 3-5 steps with a railing? : None 6 Click Score: 24    End of Session   Activity Tolerance: Patient tolerated treatment well Patient left: in chair Nurse Communication: Mobility status PT Visit Diagnosis: Other abnormalities of gait and mobility (R26.89)    Time: 0948-1000 PT Time Calculation (min) (ACUTE ONLY): 12 min   Charges:   PT Evaluation $PT Eval Low Complexity: 1 Low          Lorrin Goodell, PT  Office # [phone] Pager [phone]   Lorriane Shire 05/04/2018, 10:35 AM

## 2018-05-04 NOTE — Discharge Instructions (Signed)
DO NOT FILL LISINOPRIL---- INSTEAD FILL LOSARTAN!!! Be sure to eat small meals throughout the day

## 2018-05-04 NOTE — Progress Notes (Signed)
  Echocardiogram 2D Echocardiogram has been performed.  Rodney Bridges M 05/04/2018, 9:14 AM

## 2018-05-04 NOTE — Discharge Summary (Signed)
Physician Discharge Summary  Lem Peary TOI:[id number] DOB: 1946-03-09 DOA: 05/03/2018  PCP: Shirline Frees, MD  Admit date: 05/03/2018 Discharge date: 05/04/2018  Admitted From: home Discharge disposition: home   Recommendations for Outpatient Follow-Up:   1. Monitor blood sugar-- encouraged small meals through out the day 2. Outpatient neurology follow up for TIA-- may need full mental status evaluation 3. Continue HD 4. Monitor blood pressures-- adjust medications as needed   Discharge Diagnosis:   Active Problems:   TIA (transient ischemic attack)    Discharge Condition: Improved.  Diet recommendation: renal  Wound care: None.  Code status: Full.   History of Present Illness:   Patient is a 72 year old male with past medical history significant for end-stage renal disease on hemodialysis on Tuesday, Thursday and Saturday; secondary hyperparathyroidism, type I renal tubular acidosis, proteinuria, hypertension, hyperlipidemia, head injury and history of hyperkalemia.  Patient presents with a 2-week history of intermittent left-sided numbness.  Patient describes numbness involving the left upper extremity, left lower extremity and left side of the face.  Numbness is said to be intermittent and would normally last for about 5 minutes.  Patient was on aspirin but stopped taking aspirin about 2 to 4 weeks ago.  No headache, no neck pain, no chest pain, no fever or chills, no GI symptoms and no urinary symptoms.  Systolic blood pressure is noted to be around 180 mmHg.  Patient's potassium is 5.5 today, but today's patient's hemodialysis today.   Hospital Course by Problem:   TIA: MRI of the Rodney Bridges - negative -HgbA1c <7 -LDL < 70 -did well with PT -increase ASA to 325 for now-- may be adjusted back to 81 mg-- not convinced he was taking at home on a regular basis -stopped alcohol consumption 4 weeks ago  ESRD on hemodialysis: S/p HD -outpatient follow  up  Hypertension: Resume home meds -add ARB as he gets a cough from ACE- discussed with his nephrologist  Hyperlipidemia: Continue Lipitor     Medical Consultants:   Renal neurology   Discharge Exam:   Vitals:   05/04/18 1354 05/04/18 1502  BP: (!) 173/91 (!) 173/83  Pulse: 65 65  Resp: 15 16  Temp:    SpO2: 100% 96%   Vitals:   05/04/18 0515 05/04/18 1100 05/04/18 1354 05/04/18 1502  BP: (!) 160/88 (!) 177/104 (!) 173/91 (!) 173/83  Pulse: 74 74 65 65  Resp: 14  15 16   Temp:      TempSrc:      SpO2: 93%  100% 96%  Weight:      Height:        General exam: Appears calm and comfortable. Up walking around the room   The results of significant diagnostics from this hospitalization (including imaging, microbiology, ancillary and laboratory) are listed below for reference.     Procedures and Diagnostic Studies:   Ct Head Wo Contrast  Result Date: 05/03/2018 CLINICAL DATA:  TIA, tingling from arms to fingers on 1 episode with with another episode of tingling in the face, history chronic kidney disease on dialysis, hypertension, former smoker EXAM: CT HEAD WITHOUT CONTRAST TECHNIQUE: Contiguous axial images were obtained from the base of the skull through the vertex without intravenous contrast. Sagittal and coronal MPR images reconstructed from axial data set. COMPARISON:  None FINDINGS: Rodney Bridges: Mild generalized atrophy. Normal ventricular morphology. No midline shift or mass effect. Otherwise normal appearance of Rodney Bridges parenchyma. No intracranial hemorrhage, mass lesion or evidence of acute infarction.  No extra-axial fluid collections. Vascular: Mild atherosclerotic calcification of internal carotid arteries bilaterally at skull base Skull: Intact Sinuses/Orbits: Clear Other: N/A IMPRESSION: No acute intracranial abnormalities. Electronically Signed   By: Lavonia Dana M.D.   On: 05/03/2018 16:15   Mr Rodney Bridges Head Wo Contrast  Result Date: 05/03/2018 CLINICAL DATA:  72  y/o M; 2 week history of intermittent left-sided numbness. TIA, initial exam. EXAM: MRI HEAD WITHOUT CONTRAST MRA HEAD WITHOUT CONTRAST MRA NECK WITHOUT CONTRAST TECHNIQUE: Multiplanar, multiecho pulse sequences of the Rodney Bridges and surrounding structures were obtained without intravenous contrast. Angiographic images of the Circle of Willis were obtained using MRA technique without intravenous contrast. Angiographic images of the neck were obtained using MRA technique without intravenous contrast. Carotid stenosis measurements (when applicable) are obtained utilizing NASCET criteria, using the distal internal carotid diameter as the denominator. COMPARISON:  05/03/2018 CT head. FINDINGS: MRI HEAD FINDINGS Rodney Bridges: No acute infarction, hemorrhage, hydrocephalus, extra-axial collection or mass lesion. Mild volume loss of the Rodney Bridges for age. Vascular: As below. Skull and upper cervical spine: Normal marrow signal. Sinuses/Orbits: Negative. Other: None. MRA HEAD FINDINGS Internal carotid arteries:  Patent. Anterior cerebral arteries:  Patent. Middle cerebral arteries: Patent. Anterior communicating artery: Patent. Posterior communicating arteries: Fetal right PCA. No left posterior communicating artery identified, likely hypoplastic or absent. Posterior cerebral arteries:  Patent. Basilar artery:  Patent. Vertebral arteries:  Patent. No evidence of high-grade stenosis, large vessel occlusion, or aneurysm. MRA NECK FINDINGS Aortic arch: Patent. Right common carotid artery: Patent. Right internal carotid artery: Patent. Right vertebral artery: Patent. Left common carotid artery: Patent. Left Internal carotid artery: Patent. Left Vertebral artery: Patent. There is no evidence of hemodynamically significant stenosis by NASCET criteria, occlusion, or aneurysm. IMPRESSION: 1. No acute intracranial abnormality. Mild volume loss of the Rodney Bridges for age. 2. Patent carotid and vertebral arteries. No dissection, aneurysm, or  hemodynamically significant stenosis utilizing NASCET criteria. 3. Patent anterior and posterior intracranial circulation. No large vessel occlusion, aneurysm, or significant stenosis. Electronically Signed   By: Kristine Garbe M.D.   On: 05/03/2018 23:07   Mr Rodney Bridges Neck Wo Contrast  Result Date: 05/03/2018 CLINICAL DATA:  72 y/o M; 2 week history of intermittent left-sided numbness. TIA, initial exam. EXAM: MRI HEAD WITHOUT CONTRAST MRA HEAD WITHOUT CONTRAST MRA NECK WITHOUT CONTRAST TECHNIQUE: Multiplanar, multiecho pulse sequences of the Rodney Bridges and surrounding structures were obtained without intravenous contrast. Angiographic images of the Circle of Willis were obtained using MRA technique without intravenous contrast. Angiographic images of the neck were obtained using MRA technique without intravenous contrast. Carotid stenosis measurements (when applicable) are obtained utilizing NASCET criteria, using the distal internal carotid diameter as the denominator. COMPARISON:  05/03/2018 CT head. FINDINGS: MRI HEAD FINDINGS Rodney Bridges: No acute infarction, hemorrhage, hydrocephalus, extra-axial collection or mass lesion. Mild volume loss of the Rodney Bridges for age. Vascular: As below. Skull and upper cervical spine: Normal marrow signal. Sinuses/Orbits: Negative. Other: None. MRA HEAD FINDINGS Internal carotid arteries:  Patent. Anterior cerebral arteries:  Patent. Middle cerebral arteries: Patent. Anterior communicating artery: Patent. Posterior communicating arteries: Fetal right PCA. No left posterior communicating artery identified, likely hypoplastic or absent. Posterior cerebral arteries:  Patent. Basilar artery:  Patent. Vertebral arteries:  Patent. No evidence of high-grade stenosis, large vessel occlusion, or aneurysm. MRA NECK FINDINGS Aortic arch: Patent. Right common carotid artery: Patent. Right internal carotid artery: Patent. Right vertebral artery: Patent. Left common carotid artery: Patent. Left  Internal carotid artery: Patent. Left Vertebral artery: Patent. There is no  evidence of hemodynamically significant stenosis by NASCET criteria, occlusion, or aneurysm. IMPRESSION: 1. No acute intracranial abnormality. Mild volume loss of the Rodney Bridges for age. 2. Patent carotid and vertebral arteries. No dissection, aneurysm, or hemodynamically significant stenosis utilizing NASCET criteria. 3. Patent anterior and posterior intracranial circulation. No large vessel occlusion, aneurysm, or significant stenosis. Electronically Signed   By: Kristine Garbe M.D.   On: 05/03/2018 23:07   Mr Rodney Bridges Wo Contrast  Result Date: 05/03/2018 CLINICAL DATA:  72 y/o M; 2 week history of intermittent left-sided numbness. TIA, initial exam. EXAM: MRI HEAD WITHOUT CONTRAST MRA HEAD WITHOUT CONTRAST MRA NECK WITHOUT CONTRAST TECHNIQUE: Multiplanar, multiecho pulse sequences of the Rodney Bridges and surrounding structures were obtained without intravenous contrast. Angiographic images of the Circle of Willis were obtained using MRA technique without intravenous contrast. Angiographic images of the neck were obtained using MRA technique without intravenous contrast. Carotid stenosis measurements (when applicable) are obtained utilizing NASCET criteria, using the distal internal carotid diameter as the denominator. COMPARISON:  05/03/2018 CT head. FINDINGS: MRI HEAD FINDINGS Rodney Bridges: No acute infarction, hemorrhage, hydrocephalus, extra-axial collection or mass lesion. Mild volume loss of the Rodney Bridges for age. Vascular: As below. Skull and upper cervical spine: Normal marrow signal. Sinuses/Orbits: Negative. Other: None. MRA HEAD FINDINGS Internal carotid arteries:  Patent. Anterior cerebral arteries:  Patent. Middle cerebral arteries: Patent. Anterior communicating artery: Patent. Posterior communicating arteries: Fetal right PCA. No left posterior communicating artery identified, likely hypoplastic or absent. Posterior cerebral arteries:   Patent. Basilar artery:  Patent. Vertebral arteries:  Patent. No evidence of high-grade stenosis, large vessel occlusion, or aneurysm. MRA NECK FINDINGS Aortic arch: Patent. Right common carotid artery: Patent. Right internal carotid artery: Patent. Right vertebral artery: Patent. Left common carotid artery: Patent. Left Internal carotid artery: Patent. Left Vertebral artery: Patent. There is no evidence of hemodynamically significant stenosis by NASCET criteria, occlusion, or aneurysm. IMPRESSION: 1. No acute intracranial abnormality. Mild volume loss of the Rodney Bridges for age. 2. Patent carotid and vertebral arteries. No dissection, aneurysm, or hemodynamically significant stenosis utilizing NASCET criteria. 3. Patent anterior and posterior intracranial circulation. No large vessel occlusion, aneurysm, or significant stenosis. Electronically Signed   By: Kristine Garbe M.D.   On: 05/03/2018 23:07     Labs:   Basic Metabolic Panel: Recent Labs  Lab 05/03/18 1335 05/03/18 1346 05/03/18 2016 05/04/18 0151  NA 142 139  --  140  K 5.7* 5.5*  --  6.2*  CL 100 100  --  101  CO2 28  --   --  26  GLUCOSE 166* 164*  --  92  BUN 49* 48*  --  58*  CREATININE 9.99* 10.60* 10.59* 11.09*  CALCIUM 9.8  --   --  9.1  MG  --   --  3.0*  --   PHOS  --   --  6.1* 6.6*   GFR Estimated Creatinine Clearance: 5.5 mL/min (A) (by C-G formula based on SCr of 11.09 mg/dL (H)). Liver Function Tests: Recent Labs  Lab 05/03/18 1335 05/04/18 0151  AST 25  --   ALT 20  --   ALKPHOS 53  --   BILITOT 0.9  --   PROT 6.2*  --   ALBUMIN 3.9 3.3*   No results for input(s): LIPASE, AMYLASE in the last 168 hours. No results for input(s): AMMONIA in the last 168 hours. Coagulation profile Recent Labs  Lab 05/03/18 1335  INR 1.04    CBC: Recent Labs  Lab 05/03/18  1335 05/03/18 1346 05/03/18 2016 05/04/18 0150  WBC 9.7  --  9.1 8.8  NEUTROABS 6.4  --   --   --   HGB 11.3* 11.2* 10.4* 9.8*  HCT  34.3* 33.0* 32.3* 30.7*  MCV 101.2*  --  100.6* 101.0*  PLT 169  --  156 153   Cardiac Enzymes: No results for input(s): CKTOTAL, CKMB, CKMBINDEX, TROPONINI in the last 168 hours. BNP: Invalid input(s): POCBNP CBG: Recent Labs  Lab 05/03/18 1342  GLUCAP 152*   D-Dimer No results for input(s): DDIMER in the last 72 hours. Hgb A1c Recent Labs    05/03/18 2016  HGBA1C 4.7*   Lipid Profile Recent Labs    05/03/18 2016  CHOL 96  HDL 66  LDLCALC 20  TRIG 52  CHOLHDL 1.5   Thyroid function studies No results for input(s): TSH, T4TOTAL, T3FREE, THYROIDAB in the last 72 hours.  Invalid input(s): FREET3 Anemia work up No results for input(s): VITAMINB12, FOLATE, FERRITIN, TIBC, IRON, RETICCTPCT in the last 72 hours. Microbiology No results found for this or any previous visit (from the past 240 hour(s)).   Discharge Instructions:   Discharge Instructions    Ambulatory referral to Neurology   Complete by:  As directed    An appointment is requested in approximately: 6 weeks   Discharge instructions   Complete by:  As directed    Renal diet   Increase activity slowly   Complete by:  As directed      Allergies as of 05/04/2018      Reactions   Bee Venom Swelling   Swells badly with bee stings (WELTS at site and lightheadedness)   Prednisone Swelling, Other (See Comments)   Difficulty swallowing/reported by patient   Shellfish-derived Products Hives, Rash, Other (See Comments)   Gets light-headed, as well   Shrimp [shellfish Allergy] Hives, Rash      Medication List    STOP taking these medications   acetaminophen 500 MG tablet Commonly known as:  TYLENOL   tadalafil 20 MG tablet Commonly known as:  ADCIRCA/CIALIS     TAKE these medications   allopurinol 100 MG tablet Commonly known as:  ZYLOPRIM Take 100 mg by mouth daily.   amLODipine 10 MG tablet Commonly known as:  NORVASC Take 10 mg by mouth at bedtime.   aspirin 325 MG tablet Take 1 tablet  (325 mg total) by mouth daily. Start taking on:  05/05/2018 What changed:    medication strength  how much to take  additional instructions   atorvastatin 20 MG tablet Commonly known as:  LIPITOR Take 20 mg by mouth daily. Reported on 01/13/2016   calcitRIOL 0.25 MCG capsule Commonly known as:  ROCALTROL Take 2 capsules (0.5 mcg total) by mouth daily. Reported on 01/13/2016   carvedilol 12.5 MG tablet Commonly known as:  COREG Take 12.5 mg by mouth See admin instructions. Take 12.5 mg by mouth two times a day on Sun/Mon/Wed/Fri and 12.5 mg in the evening on Tues/Thurs/Sat   losartan 25 MG tablet Commonly known as:  COZAAR Take 1 tablet (25 mg total) by mouth daily.   multivitamin Tabs tablet Take 1 tablet by mouth daily.   sevelamer carbonate 800 MG tablet Commonly known as:  RENVELA Take 1,600-2,400 mg by mouth See admin instructions. Take 2,400 mg by mouth three times a day with meals and 800-1,600 mg with snacks   sodium bicarbonate 650 MG tablet Take 3 tablets (1,950 mg total) by mouth 2 (two)  times daily. Reported on 01/13/2016      Follow-up Information    Shirline Frees, MD Follow up in 1 week(s).   Specialty:  Family Medicine Contact information: [address] [address] [phone]            Time coordinating discharge: 35 min  Signed:  Geradine Girt  [facility] 05/04/2018, 3:26 PM

## 2018-05-04 NOTE — Progress Notes (Addendum)
[facility] Progress Note   Subjective:  Seen in room - dressed and ready for discharge. Denies CP/dyspnea, no further facial droops or parasthesias.   Objective Vitals:   05/04/18 0450 05/04/18 0515 05/04/18 1100 05/04/18 1354  BP: (!) 151/85 (!) 160/88 (!) 177/104 (!) 173/91  Pulse: 69 74 74 65  Resp: 17 14  15   Temp:      TempSrc:      SpO2: 96% 93%  100%  Weight: 69 kg     Height:       Physical Exam General: Well appearing man, NAD Heart: RRR; no murmur Extremities: 2+ pitting LE edema Dialysis Access: AVF + thrill  Additional Objective Labs: Basic Metabolic Panel: Recent Labs  Lab 05/03/18 1335 05/03/18 1346 05/03/18 2016 05/04/18 0151  NA 142 139  --  140  K 5.7* 5.5*  --  6.2*  CL 100 100  --  101  CO2 28  --   --  26  GLUCOSE 166* 164*  --  92  BUN 49* 48*  --  58*  CREATININE 9.99* 10.60* 10.59* 11.09*  CALCIUM 9.8  --   --  9.1  PHOS  --   --  6.1* 6.6*   Liver Function Tests: Recent Labs  Lab 05/03/18 1335 05/04/18 0151  AST 25  --   ALT 20  --   ALKPHOS 53  --   BILITOT 0.9  --   PROT 6.2*  --   ALBUMIN 3.9 3.3*   CBC: Recent Labs  Lab 05/03/18 1335 05/03/18 1346 05/03/18 2016 05/04/18 0150  WBC 9.7  --  9.1 8.8  NEUTROABS 6.4  --   --   --   HGB 11.3* 11.2* 10.4* 9.8*  HCT 34.3* 33.0* 32.3* 30.7*  MCV 101.2*  --  100.6* 101.0*  PLT 169  --  156 153   CBG: Recent Labs  Lab 05/03/18 1342  GLUCAP 152*   Studies/Results: Ct Head Wo Contrast  Result Date: 05/03/2018 CLINICAL DATA:  TIA, tingling from arms to fingers on 1 episode with with another episode of tingling in the face, history chronic kidney disease on dialysis, hypertension, former smoker EXAM: CT HEAD WITHOUT CONTRAST TECHNIQUE: Contiguous axial images were obtained from the base of the skull through the vertex without intravenous contrast. Sagittal and coronal MPR images reconstructed from axial data set. COMPARISON:  None FINDINGS: Brain: Mild  generalized atrophy. Normal ventricular morphology. No midline shift or mass effect. Otherwise normal appearance of brain parenchyma. No intracranial hemorrhage, mass lesion or evidence of acute infarction. No extra-axial fluid collections. Vascular: Mild atherosclerotic calcification of internal carotid arteries bilaterally at skull base Skull: Intact Sinuses/Orbits: Clear Other: N/A IMPRESSION: No acute intracranial abnormalities. Electronically Signed   By: Lavonia Dana M.D.   On: 05/03/2018 16:15   Mr Jodene Nam Head Wo Contrast  Result Date: 05/03/2018 CLINICAL DATA:  72 y/o M; 2 week history of intermittent left-sided numbness. TIA, initial exam. EXAM: MRI HEAD WITHOUT CONTRAST MRA HEAD WITHOUT CONTRAST MRA NECK WITHOUT CONTRAST TECHNIQUE: Multiplanar, multiecho pulse sequences of the brain and surrounding structures were obtained without intravenous contrast. Angiographic images of the Circle of Willis were obtained using MRA technique without intravenous contrast. Angiographic images of the neck were obtained using MRA technique without intravenous contrast. Carotid stenosis measurements (when applicable) are obtained utilizing NASCET criteria, using the distal internal carotid diameter as the denominator. COMPARISON:  05/03/2018 CT head. FINDINGS: MRI HEAD FINDINGS Brain: No acute infarction, hemorrhage, hydrocephalus,  extra-axial collection or mass lesion. Mild volume loss of the brain for age. Vascular: As below. Skull and upper cervical spine: Normal marrow signal. Sinuses/Orbits: Negative. Other: None. MRA HEAD FINDINGS Internal carotid arteries:  Patent. Anterior cerebral arteries:  Patent. Middle cerebral arteries: Patent. Anterior communicating artery: Patent. Posterior communicating arteries: Fetal right PCA. No left posterior communicating artery identified, likely hypoplastic or absent. Posterior cerebral arteries:  Patent. Basilar artery:  Patent. Vertebral arteries:  Patent. No evidence of  high-grade stenosis, large vessel occlusion, or aneurysm. MRA NECK FINDINGS Aortic arch: Patent. Right common carotid artery: Patent. Right internal carotid artery: Patent. Right vertebral artery: Patent. Left common carotid artery: Patent. Left Internal carotid artery: Patent. Left Vertebral artery: Patent. There is no evidence of hemodynamically significant stenosis by NASCET criteria, occlusion, or aneurysm. IMPRESSION: 1. No acute intracranial abnormality. Mild volume loss of the brain for age. 2. Patent carotid and vertebral arteries. No dissection, aneurysm, or hemodynamically significant stenosis utilizing NASCET criteria. 3. Patent anterior and posterior intracranial circulation. No large vessel occlusion, aneurysm, or significant stenosis. Electronically Signed   By: Kristine Garbe M.D.   On: 05/03/2018 23:07   Mr Jodene Nam Neck Wo Contrast  Result Date: 05/03/2018 CLINICAL DATA:  72 y/o M; 2 week history of intermittent left-sided numbness. TIA, initial exam. EXAM: MRI HEAD WITHOUT CONTRAST MRA HEAD WITHOUT CONTRAST MRA NECK WITHOUT CONTRAST TECHNIQUE: Multiplanar, multiecho pulse sequences of the brain and surrounding structures were obtained without intravenous contrast. Angiographic images of the Circle of Willis were obtained using MRA technique without intravenous contrast. Angiographic images of the neck were obtained using MRA technique without intravenous contrast. Carotid stenosis measurements (when applicable) are obtained utilizing NASCET criteria, using the distal internal carotid diameter as the denominator. COMPARISON:  05/03/2018 CT head. FINDINGS: MRI HEAD FINDINGS Brain: No acute infarction, hemorrhage, hydrocephalus, extra-axial collection or mass lesion. Mild volume loss of the brain for age. Vascular: As below. Skull and upper cervical spine: Normal marrow signal. Sinuses/Orbits: Negative. Other: None. MRA HEAD FINDINGS Internal carotid arteries:  Patent. Anterior cerebral  arteries:  Patent. Middle cerebral arteries: Patent. Anterior communicating artery: Patent. Posterior communicating arteries: Fetal right PCA. No left posterior communicating artery identified, likely hypoplastic or absent. Posterior cerebral arteries:  Patent. Basilar artery:  Patent. Vertebral arteries:  Patent. No evidence of high-grade stenosis, large vessel occlusion, or aneurysm. MRA NECK FINDINGS Aortic arch: Patent. Right common carotid artery: Patent. Right internal carotid artery: Patent. Right vertebral artery: Patent. Left common carotid artery: Patent. Left Internal carotid artery: Patent. Left Vertebral artery: Patent. There is no evidence of hemodynamically significant stenosis by NASCET criteria, occlusion, or aneurysm. IMPRESSION: 1. No acute intracranial abnormality. Mild volume loss of the brain for age. 2. Patent carotid and vertebral arteries. No dissection, aneurysm, or hemodynamically significant stenosis utilizing NASCET criteria. 3. Patent anterior and posterior intracranial circulation. No large vessel occlusion, aneurysm, or significant stenosis. Electronically Signed   By: Kristine Garbe M.D.   On: 05/03/2018 23:07   Mr Brain Wo Contrast  Result Date: 05/03/2018 CLINICAL DATA:  72 y/o M; 2 week history of intermittent left-sided numbness. TIA, initial exam. EXAM: MRI HEAD WITHOUT CONTRAST MRA HEAD WITHOUT CONTRAST MRA NECK WITHOUT CONTRAST TECHNIQUE: Multiplanar, multiecho pulse sequences of the brain and surrounding structures were obtained without intravenous contrast. Angiographic images of the Circle of Willis were obtained using MRA technique without intravenous contrast. Angiographic images of the neck were obtained using MRA technique without intravenous contrast. Carotid stenosis measurements (when applicable) are obtained  utilizing NASCET criteria, using the distal internal carotid diameter as the denominator. COMPARISON:  05/03/2018 CT head. FINDINGS: MRI HEAD  FINDINGS Brain: No acute infarction, hemorrhage, hydrocephalus, extra-axial collection or mass lesion. Mild volume loss of the brain for age. Vascular: As below. Skull and upper cervical spine: Normal marrow signal. Sinuses/Orbits: Negative. Other: None. MRA HEAD FINDINGS Internal carotid arteries:  Patent. Anterior cerebral arteries:  Patent. Middle cerebral arteries: Patent. Anterior communicating artery: Patent. Posterior communicating arteries: Fetal right PCA. No left posterior communicating artery identified, likely hypoplastic or absent. Posterior cerebral arteries:  Patent. Basilar artery:  Patent. Vertebral arteries:  Patent. No evidence of high-grade stenosis, large vessel occlusion, or aneurysm. MRA NECK FINDINGS Aortic arch: Patent. Right common carotid artery: Patent. Right internal carotid artery: Patent. Right vertebral artery: Patent. Left common carotid artery: Patent. Left Internal carotid artery: Patent. Left Vertebral artery: Patent. There is no evidence of hemodynamically significant stenosis by NASCET criteria, occlusion, or aneurysm. IMPRESSION: 1. No acute intracranial abnormality. Mild volume loss of the brain for age. 2. Patent carotid and vertebral arteries. No dissection, aneurysm, or hemodynamically significant stenosis utilizing NASCET criteria. 3. Patent anterior and posterior intracranial circulation. No large vessel occlusion, aneurysm, or significant stenosis. Electronically Signed   By: Kristine Garbe M.D.   On: 05/03/2018 23:07   Medications: . sodium chloride    . sodium chloride     . allopurinol  100 mg Oral Daily  . amLODipine  10 mg Oral QHS  . aspirin  325 mg Oral Daily  . atorvastatin  20 mg Oral Daily  . calcitRIOL  0.5 mcg Oral Daily  . carvedilol  12.5 mg Oral BID  . Chlorhexidine Gluconate Cloth  6 each Topical Q0600  . [START ON 05/05/2018] doxercalciferol  2 mcg Intravenous Q T,Th,Sa-HD  . folic acid  1 mg Oral Daily  . heparin  5,000 Units  Subcutaneous Q8H  . lisinopril  10 mg Oral Daily  . multivitamin  1 tablet Oral QHS  . sevelamer carbonate  2,400 mg Oral TID WC  . sodium bicarbonate  1,950 mg Oral BID  . thiamine  100 mg Oral Daily    Dialysis Orders: TTS at [facility] 69kg  Assessment/Plan: 1.  TIA symptoms: Neuro consulted. Echo and MRI/MRA without acute findings. 2.  End-stage renal disease: Missed HD prior to admission, dialyzed overnight 10/17 d/t hyperK. 3.  Hypertension: BP remains high, + edema. Reducing EDW on d/c. 4.  Secondary hyperparathyroidism: Ca ok, Phos high -- continue outpt binders. 5.  Anemia of chronic kidney disease: Hgb 9.8. resume ESA on discharge.  Veneta Penton, PA-C 05/04/2018, 2:53 PM  [facility] Pager: [phone]

## 2018-05-04 NOTE — Progress Notes (Signed)
Pt last alcoholic drink was 4 weeks ago.

## 2018-05-04 NOTE — Progress Notes (Signed)
HD tx ended 55 min early d/t his insistance to come off after 3 hrs. Pt would have had to come off 25 min early anyway for unit bleaching today but when I tried to get him to stay off he snapped at me and said he didn't need to come until Saturday and wanted off, UF goal was still met as I set the machine to come off at 430 incase that was the time for having to bleach, blood rinsed back, VSS, report called to Riesa Pope, RN

## 2018-05-05 DIAGNOSIS — D631 Anemia in chronic kidney disease: Secondary | ICD-10-CM | POA: Diagnosis not present

## 2018-05-05 DIAGNOSIS — N186 End stage renal disease: Secondary | ICD-10-CM | POA: Diagnosis not present

## 2018-05-05 DIAGNOSIS — N2581 Secondary hyperparathyroidism of renal origin: Secondary | ICD-10-CM | POA: Diagnosis not present

## 2018-05-05 DIAGNOSIS — E611 Iron deficiency: Secondary | ICD-10-CM | POA: Diagnosis not present

## 2018-05-08 DIAGNOSIS — D631 Anemia in chronic kidney disease: Secondary | ICD-10-CM | POA: Diagnosis not present

## 2018-05-08 DIAGNOSIS — N186 End stage renal disease: Secondary | ICD-10-CM | POA: Diagnosis not present

## 2018-05-08 DIAGNOSIS — N2581 Secondary hyperparathyroidism of renal origin: Secondary | ICD-10-CM | POA: Diagnosis not present

## 2018-05-08 DIAGNOSIS — E611 Iron deficiency: Secondary | ICD-10-CM | POA: Diagnosis not present

## 2018-05-10 DIAGNOSIS — N186 End stage renal disease: Secondary | ICD-10-CM | POA: Diagnosis not present

## 2018-05-10 DIAGNOSIS — D631 Anemia in chronic kidney disease: Secondary | ICD-10-CM | POA: Diagnosis not present

## 2018-05-10 DIAGNOSIS — E611 Iron deficiency: Secondary | ICD-10-CM | POA: Diagnosis not present

## 2018-05-10 DIAGNOSIS — N2581 Secondary hyperparathyroidism of renal origin: Secondary | ICD-10-CM | POA: Diagnosis not present

## 2018-05-12 DIAGNOSIS — E611 Iron deficiency: Secondary | ICD-10-CM | POA: Diagnosis not present

## 2018-05-12 DIAGNOSIS — N2581 Secondary hyperparathyroidism of renal origin: Secondary | ICD-10-CM | POA: Diagnosis not present

## 2018-05-12 DIAGNOSIS — D631 Anemia in chronic kidney disease: Secondary | ICD-10-CM | POA: Diagnosis not present

## 2018-05-12 DIAGNOSIS — N186 End stage renal disease: Secondary | ICD-10-CM | POA: Diagnosis not present

## 2018-05-15 DIAGNOSIS — D631 Anemia in chronic kidney disease: Secondary | ICD-10-CM | POA: Diagnosis not present

## 2018-05-15 DIAGNOSIS — E611 Iron deficiency: Secondary | ICD-10-CM | POA: Diagnosis not present

## 2018-05-15 DIAGNOSIS — N2581 Secondary hyperparathyroidism of renal origin: Secondary | ICD-10-CM | POA: Diagnosis not present

## 2018-05-15 DIAGNOSIS — N186 End stage renal disease: Secondary | ICD-10-CM | POA: Diagnosis not present

## 2018-05-17 DIAGNOSIS — D631 Anemia in chronic kidney disease: Secondary | ICD-10-CM | POA: Diagnosis not present

## 2018-05-17 DIAGNOSIS — N2581 Secondary hyperparathyroidism of renal origin: Secondary | ICD-10-CM | POA: Diagnosis not present

## 2018-05-17 DIAGNOSIS — E611 Iron deficiency: Secondary | ICD-10-CM | POA: Diagnosis not present

## 2018-05-17 DIAGNOSIS — N186 End stage renal disease: Secondary | ICD-10-CM | POA: Diagnosis not present

## 2018-05-18 DIAGNOSIS — N186 End stage renal disease: Secondary | ICD-10-CM | POA: Diagnosis not present

## 2018-05-18 DIAGNOSIS — N049 Nephrotic syndrome with unspecified morphologic changes: Secondary | ICD-10-CM | POA: Diagnosis not present

## 2018-05-18 DIAGNOSIS — Z992 Dependence on renal dialysis: Secondary | ICD-10-CM | POA: Diagnosis not present

## 2018-05-19 DIAGNOSIS — N2581 Secondary hyperparathyroidism of renal origin: Secondary | ICD-10-CM | POA: Diagnosis not present

## 2018-05-19 DIAGNOSIS — E611 Iron deficiency: Secondary | ICD-10-CM | POA: Diagnosis not present

## 2018-05-19 DIAGNOSIS — E875 Hyperkalemia: Secondary | ICD-10-CM | POA: Diagnosis not present

## 2018-05-19 DIAGNOSIS — N186 End stage renal disease: Secondary | ICD-10-CM | POA: Diagnosis not present

## 2018-05-19 DIAGNOSIS — D631 Anemia in chronic kidney disease: Secondary | ICD-10-CM | POA: Diagnosis not present

## 2018-05-22 DIAGNOSIS — E611 Iron deficiency: Secondary | ICD-10-CM | POA: Diagnosis not present

## 2018-05-22 DIAGNOSIS — D631 Anemia in chronic kidney disease: Secondary | ICD-10-CM | POA: Diagnosis not present

## 2018-05-22 DIAGNOSIS — N186 End stage renal disease: Secondary | ICD-10-CM | POA: Diagnosis not present

## 2018-05-22 DIAGNOSIS — E875 Hyperkalemia: Secondary | ICD-10-CM | POA: Diagnosis not present

## 2018-05-22 DIAGNOSIS — N2581 Secondary hyperparathyroidism of renal origin: Secondary | ICD-10-CM | POA: Diagnosis not present

## 2018-05-26 DIAGNOSIS — N2581 Secondary hyperparathyroidism of renal origin: Secondary | ICD-10-CM | POA: Diagnosis not present

## 2018-05-26 DIAGNOSIS — D631 Anemia in chronic kidney disease: Secondary | ICD-10-CM | POA: Diagnosis not present

## 2018-05-26 DIAGNOSIS — N186 End stage renal disease: Secondary | ICD-10-CM | POA: Diagnosis not present

## 2018-05-26 DIAGNOSIS — E875 Hyperkalemia: Secondary | ICD-10-CM | POA: Diagnosis not present

## 2018-05-26 DIAGNOSIS — E611 Iron deficiency: Secondary | ICD-10-CM | POA: Diagnosis not present

## 2018-05-29 DIAGNOSIS — E611 Iron deficiency: Secondary | ICD-10-CM | POA: Diagnosis not present

## 2018-05-29 DIAGNOSIS — N186 End stage renal disease: Secondary | ICD-10-CM | POA: Diagnosis not present

## 2018-05-29 DIAGNOSIS — N2581 Secondary hyperparathyroidism of renal origin: Secondary | ICD-10-CM | POA: Diagnosis not present

## 2018-05-29 DIAGNOSIS — D631 Anemia in chronic kidney disease: Secondary | ICD-10-CM | POA: Diagnosis not present

## 2018-05-29 DIAGNOSIS — E875 Hyperkalemia: Secondary | ICD-10-CM | POA: Diagnosis not present

## 2018-05-31 DIAGNOSIS — N186 End stage renal disease: Secondary | ICD-10-CM | POA: Diagnosis not present

## 2018-05-31 DIAGNOSIS — E875 Hyperkalemia: Secondary | ICD-10-CM | POA: Diagnosis not present

## 2018-05-31 DIAGNOSIS — D631 Anemia in chronic kidney disease: Secondary | ICD-10-CM | POA: Diagnosis not present

## 2018-05-31 DIAGNOSIS — E611 Iron deficiency: Secondary | ICD-10-CM | POA: Diagnosis not present

## 2018-05-31 DIAGNOSIS — N2581 Secondary hyperparathyroidism of renal origin: Secondary | ICD-10-CM | POA: Diagnosis not present

## 2018-06-01 ENCOUNTER — Encounter: Payer: Self-pay | Admitting: Pulmonary Disease

## 2018-06-01 ENCOUNTER — Ambulatory Visit (INDEPENDENT_AMBULATORY_CARE_PROVIDER_SITE_OTHER): Payer: Medicare Other | Admitting: Pulmonary Disease

## 2018-06-01 VITALS — BP 160/76 | HR 56 | Ht 64.5 in | Wt 151.0 lb

## 2018-06-01 DIAGNOSIS — N186 End stage renal disease: Secondary | ICD-10-CM

## 2018-06-01 DIAGNOSIS — R05 Cough: Secondary | ICD-10-CM | POA: Diagnosis not present

## 2018-06-01 DIAGNOSIS — Z992 Dependence on renal dialysis: Secondary | ICD-10-CM | POA: Diagnosis not present

## 2018-06-01 DIAGNOSIS — R053 Chronic cough: Secondary | ICD-10-CM

## 2018-06-01 MED ORDER — ALBUTEROL SULFATE HFA 108 (90 BASE) MCG/ACT IN AERS: 2.0000 | INHALATION_SPRAY | Freq: Four times a day (QID) | RESPIRATORY_TRACT | 2 refills | Status: AC | PRN

## 2018-06-01 NOTE — Progress Notes (Signed)
Synopsis: Referred in 05/2018 for cough  Subjective:   PATIENT ID: Rodney Bridges GENDER: male DOB: 10-28-1945, MRN: [id number]   HPI  Chief Complaint  Patient presents with  . CONSULT    non-prod cough x 4 months - worse when lying down.  On dialysis waiting for transplant.     [name] is a 72 year old male with ESRD on HD who presents as new patient for evaluation of chronic cough.  He reports assistant nonproductive cough that began 3 months ago.  Occasionally has shortness of breath however no wheezing associated.  Symptoms occur throughout the day and will awaken him at night. Aggravated immediately after meals. Has not tried inhalers. No recent infection or sputum production.  When he once missed a session of dialysis, he had shortness of breath but did not notice worsening cough.  He occasionally notices trouble swallowing but denies choking or coughing during these times.  Denies association with allergies. Denies headaches, dizziness, chest pain, retrosternal chest pain/burning, nausea, abdominal pain or lower extremity edema. He reports itchy rash that began a few weeks ago. Has seasonal allergies with nasal congestion.   Former smoker. 5 pack history. Quit in [place] exposures: None  I have personally reviewed patient's past medical/family/social history, allergies, current medications.  Past Medical History:  Diagnosis Date  . Anemia   . Chronic kidney disease, stage IV (severe) ([place])   . Dialysis patient [facility]) tues thurs sat  . Erectile dysfunction   . Head injury, closed, with concussion    x 2 in High School  . Hyperkalemia   . Hyperlipidemia   . Hypertension   . Proteinuria   . Renal tubular acidosis, type 1   . Secondary hyperparathyroidism of renal origin [facility])      Family History  Problem Relation Age of Onset  . Hypertension Father   . Diabetes Father      Social History   Occupational History  . Not on file  Tobacco  Use  . Smoking status: Former Smoker    Packs/day: 0.50    Years: 10.00    Pack years: 5.00    Last attempt to quit: 07/18/1989    Years since quitting: 28.8  . Smokeless tobacco: Never Used  . Tobacco comment: 1/2 pack a day- when smoked  Substance and Sexual Activity  . Alcohol use: Yes    Alcohol/week: 2.0 standard drinks    Types: 1 Glasses of wine, 1 Cans of beer per week    Comment: one drink of either once a week  . Drug use: No  . Sexual activity: Not on file    Allergies  Allergen Reactions  . Bee Venom Swelling    Swells badly with bee stings (WELTS at site and lightheadedness)  . Prednisone Swelling and Other (See Comments)    Difficulty swallowing/reported by patient  . Shellfish-Derived Products Hives, Rash and Other (See Comments)    Gets light-headed, as well   . Shrimp [Shellfish Allergy] Hives and Rash     Outpatient Medications Prior to Visit  Medication Sig Dispense Refill  . allopurinol (ZYLOPRIM) 100 MG tablet Take 100 mg by mouth daily.  5  . amLODipine (NORVASC) 10 MG tablet Take 10 mg by mouth at bedtime.   1  . aspirin 325 MG tablet Take 1 tablet (325 mg total) by mouth daily.    Marland Kitchen atorvastatin (LIPITOR) 20 MG tablet Take 20 mg by mouth daily. Reported on 01/13/2016    . carvedilol (  COREG) 12.5 MG tablet Take 12.5 mg by mouth See admin instructions. Take 12.5 mg by mouth two times a day on Sun/Mon/Wed/Fri and 12.5 mg in the evening on Tues/Thurs/Sat  1  . ferric citrate (AURYXIA) 1 GM 210 MG(Fe) tablet Take 420 mg by mouth 3 (three) times daily with meals.    Marland Kitchen labetalol (NORMODYNE) 100 MG tablet Take 100 mg by mouth 2 (two) times daily.    . multivitamin (RENA-VIT) TABS tablet Take 1 tablet by mouth daily.    . sevelamer carbonate (RENVELA) 800 MG tablet Take 1,600-2,400 mg by mouth See admin instructions. Take 2,400 mg by mouth three times a day with meals and 800-1,600 mg with snacks    . calcitRIOL (ROCALTROL) 0.25 MCG capsule Take 2 capsules (0.5  mcg total) by mouth daily. Reported on 01/13/2016 (Patient not taking: Reported on 06/01/2018)    . losartan (COZAAR) 25 MG tablet Take 1 tablet (25 mg total) by mouth daily. (Patient not taking: Reported on 06/01/2018) 30 tablet 0  . sodium bicarbonate 650 MG tablet Take 3 tablets (1,950 mg total) by mouth 2 (two) times daily. Reported on 01/13/2016 (Patient not taking: Reported on 06/01/2018)     No facility-administered medications prior to visit.     ROS   Objective:  Physical Exam   Vitals:   06/01/18 1118  BP: (!) 160/76  Pulse: (!) 56  SpO2: 98%  Weight: 151 lb (68.5 kg)  Height: 5' 4.5" (1.638 m)   SpO2: 98 % O2 Device: None (Room air)   Chest imaging: None in EMR  PFT: None on file   I have personally reviewed the above labs, images and tests noted above.    Assessment & Plan:   Discussion: 72 year old male with 83-month history of chronic cough.  Multiple etiologies considered including upper airway cough syndrome, reflux and undiagnosed asthma.  Patient also has a new requirement for hemodialysis so fluid shifts may also be contributing.  Will address reversible causes first.  Plan to optimize allergy regimen and prescribed SABA for symptomatic relief.  If symptoms persist, will evaluate with PFTs.  Chronic Cough --START Albuterol inhaler 2 puffs every 6 hours as needed for shortness of breath or wheezing --START Flonase nasal spray. Take 1 spray per nare daily --START claritin (aka loratadine). Take 10 mg daily --We will schedule your lung function test to rule out asthma prior to your next visit  Orders Placed This Encounter  Procedures  . Pulmonary function test    Standing Status:   Future    Standing Expiration Date:   06/02/2019    Scheduling Instructions:     PFT with ROV in 2 months (jan 2019)    Order Specific Question:   Where should this test be performed?    Answer:   [facility]    Order Specific Question:   Full PFT: includes the  following: basic spirometry, spirometry pre & post bronchodilator, diffusion capacity (DLCO), lung volumes    Answer:   Full PFT   Meds ordered this encounter  Medications  . albuterol (PROVENTIL HFA;VENTOLIN HFA) 108 (90 Base) MCG/ACT inhaler    Sig: Inhale 2 puffs into the lungs every 6 (six) hours as needed for wheezing or shortness of breath.    Dispense:  1 Inhaler    Refill:  2    Return in about 2 months (around 08/01/2018).   Thank you for choosing [facility] for your health needs!   Chi Rodman Pickle, MD [place] 06/01/2018 12:08 PM  Personal pager: [phone] If unanswered, please page CCM On-call: [phone]

## 2018-06-01 NOTE — Patient Instructions (Signed)
Chronic Cough --START Albuterol inhaler 2 puffs every 6 hours as needed for shortness of breath or wheezing --START Flonase nasal spray. Take 1 spray per nare daily --START claritin (aka loratadine). Take 10 mg daily --We will schedule your lung function test to rule out asthma prior to your next visit  Follow-up with me in 2 months

## 2018-06-02 DIAGNOSIS — N186 End stage renal disease: Secondary | ICD-10-CM | POA: Diagnosis not present

## 2018-06-02 DIAGNOSIS — E875 Hyperkalemia: Secondary | ICD-10-CM | POA: Diagnosis not present

## 2018-06-02 DIAGNOSIS — D631 Anemia in chronic kidney disease: Secondary | ICD-10-CM | POA: Diagnosis not present

## 2018-06-02 DIAGNOSIS — N2581 Secondary hyperparathyroidism of renal origin: Secondary | ICD-10-CM | POA: Diagnosis not present

## 2018-06-02 DIAGNOSIS — E611 Iron deficiency: Secondary | ICD-10-CM | POA: Diagnosis not present

## 2018-06-05 DIAGNOSIS — E875 Hyperkalemia: Secondary | ICD-10-CM | POA: Diagnosis not present

## 2018-06-05 DIAGNOSIS — D631 Anemia in chronic kidney disease: Secondary | ICD-10-CM | POA: Diagnosis not present

## 2018-06-05 DIAGNOSIS — N2581 Secondary hyperparathyroidism of renal origin: Secondary | ICD-10-CM | POA: Diagnosis not present

## 2018-06-05 DIAGNOSIS — E611 Iron deficiency: Secondary | ICD-10-CM | POA: Diagnosis not present

## 2018-06-05 DIAGNOSIS — N186 End stage renal disease: Secondary | ICD-10-CM | POA: Diagnosis not present

## 2018-06-07 DIAGNOSIS — E611 Iron deficiency: Secondary | ICD-10-CM | POA: Diagnosis not present

## 2018-06-07 DIAGNOSIS — D631 Anemia in chronic kidney disease: Secondary | ICD-10-CM | POA: Diagnosis not present

## 2018-06-07 DIAGNOSIS — N186 End stage renal disease: Secondary | ICD-10-CM | POA: Diagnosis not present

## 2018-06-07 DIAGNOSIS — N2581 Secondary hyperparathyroidism of renal origin: Secondary | ICD-10-CM | POA: Diagnosis not present

## 2018-06-07 DIAGNOSIS — E875 Hyperkalemia: Secondary | ICD-10-CM | POA: Diagnosis not present

## 2018-06-09 DIAGNOSIS — D631 Anemia in chronic kidney disease: Secondary | ICD-10-CM | POA: Diagnosis not present

## 2018-06-09 DIAGNOSIS — N2581 Secondary hyperparathyroidism of renal origin: Secondary | ICD-10-CM | POA: Diagnosis not present

## 2018-06-09 DIAGNOSIS — E611 Iron deficiency: Secondary | ICD-10-CM | POA: Diagnosis not present

## 2018-06-09 DIAGNOSIS — E875 Hyperkalemia: Secondary | ICD-10-CM | POA: Diagnosis not present

## 2018-06-09 DIAGNOSIS — N186 End stage renal disease: Secondary | ICD-10-CM | POA: Diagnosis not present

## 2018-06-11 DIAGNOSIS — D631 Anemia in chronic kidney disease: Secondary | ICD-10-CM | POA: Diagnosis not present

## 2018-06-11 DIAGNOSIS — E611 Iron deficiency: Secondary | ICD-10-CM | POA: Diagnosis not present

## 2018-06-11 DIAGNOSIS — N186 End stage renal disease: Secondary | ICD-10-CM | POA: Diagnosis not present

## 2018-06-11 DIAGNOSIS — N2581 Secondary hyperparathyroidism of renal origin: Secondary | ICD-10-CM | POA: Diagnosis not present

## 2018-06-11 DIAGNOSIS — E875 Hyperkalemia: Secondary | ICD-10-CM | POA: Diagnosis not present

## 2018-06-13 DIAGNOSIS — N186 End stage renal disease: Secondary | ICD-10-CM | POA: Diagnosis not present

## 2018-06-13 DIAGNOSIS — E875 Hyperkalemia: Secondary | ICD-10-CM | POA: Diagnosis not present

## 2018-06-13 DIAGNOSIS — N2581 Secondary hyperparathyroidism of renal origin: Secondary | ICD-10-CM | POA: Diagnosis not present

## 2018-06-13 DIAGNOSIS — E611 Iron deficiency: Secondary | ICD-10-CM | POA: Diagnosis not present

## 2018-06-13 DIAGNOSIS — D631 Anemia in chronic kidney disease: Secondary | ICD-10-CM | POA: Diagnosis not present

## 2018-06-16 DIAGNOSIS — E875 Hyperkalemia: Secondary | ICD-10-CM | POA: Diagnosis not present

## 2018-06-16 DIAGNOSIS — E611 Iron deficiency: Secondary | ICD-10-CM | POA: Diagnosis not present

## 2018-06-16 DIAGNOSIS — N186 End stage renal disease: Secondary | ICD-10-CM | POA: Diagnosis not present

## 2018-06-16 DIAGNOSIS — N2581 Secondary hyperparathyroidism of renal origin: Secondary | ICD-10-CM | POA: Diagnosis not present

## 2018-06-16 DIAGNOSIS — D631 Anemia in chronic kidney disease: Secondary | ICD-10-CM | POA: Diagnosis not present

## 2018-06-17 DIAGNOSIS — Z992 Dependence on renal dialysis: Secondary | ICD-10-CM | POA: Diagnosis not present

## 2018-06-17 DIAGNOSIS — N049 Nephrotic syndrome with unspecified morphologic changes: Secondary | ICD-10-CM | POA: Diagnosis not present

## 2018-06-17 DIAGNOSIS — N186 End stage renal disease: Secondary | ICD-10-CM | POA: Diagnosis not present

## 2018-06-19 DIAGNOSIS — N186 End stage renal disease: Secondary | ICD-10-CM | POA: Diagnosis not present

## 2018-06-19 DIAGNOSIS — E875 Hyperkalemia: Secondary | ICD-10-CM | POA: Diagnosis not present

## 2018-06-19 DIAGNOSIS — E611 Iron deficiency: Secondary | ICD-10-CM | POA: Diagnosis not present

## 2018-06-19 DIAGNOSIS — N2581 Secondary hyperparathyroidism of renal origin: Secondary | ICD-10-CM | POA: Diagnosis not present

## 2018-06-20 ENCOUNTER — Ambulatory Visit (INDEPENDENT_AMBULATORY_CARE_PROVIDER_SITE_OTHER): Payer: Medicare Other | Admitting: Neurology

## 2018-06-20 ENCOUNTER — Encounter: Payer: Self-pay | Admitting: Neurology

## 2018-06-20 VITALS — BP 147/74 | HR 51 | Ht 66.0 in | Wt 149.0 lb

## 2018-06-20 DIAGNOSIS — R251 Tremor, unspecified: Secondary | ICD-10-CM | POA: Diagnosis not present

## 2018-06-20 DIAGNOSIS — R202 Paresthesia of skin: Secondary | ICD-10-CM | POA: Diagnosis not present

## 2018-06-20 DIAGNOSIS — G459 Transient cerebral ischemic attack, unspecified: Secondary | ICD-10-CM | POA: Diagnosis not present

## 2018-06-20 NOTE — Patient Instructions (Signed)
I had a long d/w patient about his recent TIA, risk for recurrent stroke/TIAs, personally independently reviewed imaging studies and stroke evaluation results and answered questions.Continue aspirin 325 mg daily  for secondary stroke prevention and maintain strict control of hypertension with blood pressure goal below 130/90, diabetes with hemoglobin A1c goal below 6.5% and lipids with LDL cholesterol goal below 70 mg/dL. I also advised the patient to eat a healthy diet with plenty of whole grains, cereals, fruits and vegetables, exercise regularly and maintain ideal body weight .  We also talked about his intermittent tingling numbness in extremities as well as mild hand tremors and discussed possible causes.  I recommend checking EMG nerve conduction study as well as neuropathy panel labs for treatable etiologies.  Followup in the future with my nurse practitioner Janett Billow in 3 months or call earlier if necessary.  Stroke Prevention Some medical conditions and behaviors are associated with a higher chance of having a stroke. You can help prevent a stroke by making nutrition, lifestyle, and other changes, including managing any medical conditions you may have. What nutrition changes can be made?  Eat healthy foods. You can do this by: ? Choosing foods high in fiber, such as fresh fruits and vegetables and whole grains. ? Eating at least 5 or more servings of fruits and vegetables a day. Try to fill half of your plate at each meal with fruits and vegetables. ? Choosing lean protein foods, such as lean cuts of meat, poultry without skin, fish, tofu, beans, and nuts. ? Eating low-fat dairy products. ? Avoiding foods that are high in salt (sodium). This can help lower blood pressure. ? Avoiding foods that have saturated fat, trans fat, and cholesterol. This can help prevent high cholesterol. ? Avoiding processed and premade foods.  Follow your health care provider's specific guidelines for losing weight,  controlling high blood pressure (hypertension), lowering high cholesterol, and managing diabetes. These may include: ? Reducing your daily calorie intake. ? Limiting your daily sodium intake to 1,500 milligrams (mg). ? Using only healthy fats for cooking, such as olive oil, canola oil, or sunflower oil. ? Counting your daily carbohydrate intake. What lifestyle changes can be made?  Maintain a healthy weight. Talk to your health care provider about your ideal weight.  Get at least 30 minutes of moderate physical activity at least 5 days a week. Moderate activity includes brisk walking, biking, and swimming.  Do not use any products that contain nicotine or tobacco, such as cigarettes and e-cigarettes. If you need help quitting, ask your health care provider. It may also be helpful to avoid exposure to secondhand smoke.  Limit alcohol intake to no more than 1 drink a day for nonpregnant women and 2 drinks a day for men. One drink equals 12 oz of beer, 5 oz of wine, or 1 oz of hard liquor.  Stop any illegal drug use.  Avoid taking birth control pills. Talk to your health care provider about the risks of taking birth control pills if: ? You are over 59 years old. ? You smoke. ? You get migraines. ? You have ever had a blood clot. What other changes can be made?  Manage your cholesterol levels. ? Eating a healthy diet is important for preventing high cholesterol. If cholesterol cannot be managed through diet alone, you may also need to take medicines. ? Take any prescribed medicines to control your cholesterol as told by your health care provider.  Manage your diabetes. ? Eating a healthy  diet and exercising regularly are important parts of managing your blood sugar. If your blood sugar cannot be managed through diet and exercise, you may need to take medicines. ? Take any prescribed medicines to control your diabetes as told by your health care provider.  Control your hypertension. ? To  reduce your risk of stroke, try to keep your blood pressure below 130/80. ? Eating a healthy diet and exercising regularly are an important part of controlling your blood pressure. If your blood pressure cannot be managed through diet and exercise, you may need to take medicines. ? Take any prescribed medicines to control hypertension as told by your health care provider. ? Ask your health care provider if you should monitor your blood pressure at home. ? Have your blood pressure checked every year, even if your blood pressure is normal. Blood pressure increases with age and some medical conditions.  Get evaluated for sleep disorders (sleep apnea). Talk to your health care provider about getting a sleep evaluation if you snore a lot or have excessive sleepiness.  Take over-the-counter and prescription medicines only as told by your health care provider. Aspirin or blood thinners (antiplatelets or anticoagulants) may be recommended to reduce your risk of forming blood clots that can lead to stroke.  Make sure that any other medical conditions you have, such as atrial fibrillation or atherosclerosis, are managed. What are the warning signs of a stroke? The warning signs of a stroke can be easily remembered as BEFAST.  B is for balance. Signs include: ? Dizziness. ? Loss of balance or coordination. ? Sudden trouble walking.  E is for eyes. Signs include: ? A sudden change in vision. ? Trouble seeing.  F is for face. Signs include: ? Sudden weakness or numbness of the face. ? The face or eyelid drooping to one side.  A is for arms. Signs include: ? Sudden weakness or numbness of the arm, usually on one side of the body.  S is for speech. Signs include: ? Trouble speaking (aphasia). ? Trouble understanding.  T is for time. ? These symptoms may represent a serious problem that is an emergency. Do not wait to see if the symptoms will go away. Get medical help right away. Call your local  emergency services (911 in the [place] Do not drive yourself to the hospital.  Other signs of stroke may include: ? A sudden, severe headache with no known cause. ? Nausea or vomiting. ? Seizure.  Where to find more information: For more information, visit:  American Stroke Association: www.strokeassociation.org  National Stroke Association: www.stroke.org  Summary  You can prevent a stroke by eating healthy, exercising, not smoking, limiting alcohol intake, and managing any medical conditions you may have.  Do not use any products that contain nicotine or tobacco, such as cigarettes and e-cigarettes. If you need help quitting, ask your health care provider. It may also be helpful to avoid exposure to secondhand smoke.  Remember BEFAST for warning signs of stroke. Get help right away if you or a loved one has any of these signs. This information is not intended to replace advice given to you by your health care provider. Make sure you discuss any questions you have with your health care provider. Document Released: 08/11/2004 Document Revised: 08/09/2016 Document Reviewed: 08/09/2016 Elsevier Interactive Patient Education  [employer].

## 2018-06-20 NOTE — Progress Notes (Signed)
[facility] [address] [place]. [place] [address] [phone]       OFFICE CONSULT NOTE  [name] Date of Birth:  [birth year] Medical Record Number:  [id number]   Referring MD: Azalia Bilis  Reason for Referral: TIA HPI: Rodney Bridges is a 72 year old Caucasian male seen today for initial office consultation visit.  History is provided by him and review of referral medical records.  I personally reviewed imaging films in PACS.  He states that on 05/03/2018 he woke up and noticed tingling in his left forearm near his dialysis fistula site as well as the left face.  He ignored the symptoms which lasted maybe less than 10 minutes but he also noticed that when he got up and try to walk his left leg was weak and numb.  His leg in fact gave out.  He sat down and waited for symptoms are possible.  He did not mention the symptoms went to his physician for several days and subsequently got an MRI scan of the brain done on 05/03/2018 at [facility] which I personally reviewed and was normal.  MRA of the brain as well as neck both did not show any significant large vessel stenosis in the neck of the brain.  Transthoracic echocardiogram done on 05/04/2018 showed normal ejection fraction without cardiac source of embolism.  LDL cholesterol was 20 mg percent and hemoglobin A1c was 4.7.  Patient states he has had no further recurrent stroke or TIA symptoms but on inquiry complains of intermittent tingling in his fingertips as well as occasionally involving his feet.  He is also noticed some mild nondisabling tremor of his upper extremities intermittently.  He admits that tremor seems to be more prominent when he is anxious.  The tremor is mild and does not interfere with day-to-day activities or holding a cup of coffee or a newspaper.  He has not had any lab work done for his tremors.  He has no prior history of strokes TIAs seizures or significant neurological  problems.  He has been started on aspirin which is tolerating well without bruising or bleeding.  He has chronic renal failure and undergoes dialysis 3 days a week.  He is also on Lipitor which is tolerating well without muscle aches and pains.  His blood pressure is well controlled and he takes Norvasc and Cozaar.  ROS:   14 system review of systems is positive for chills, hearing loss, ringing the ears, cough, easy bruising, memory loss, confusion, headache, difficulty swallowing, dizziness, tremor, disinterest in activities, shiftwork, restless legs and all other systems negative  PMH:  Past Medical History:  Diagnosis Date  . Anemia   . Chronic kidney disease, stage IV (severe) ([place])   . Dialysis patient [facility]) tues thurs sat  . Erectile dysfunction   . Head injury, closed, with concussion    x 2 in High School  . Hyperkalemia   . Hyperlipidemia   . Hypertension   . Proteinuria   . Renal tubular acidosis, type 1   . Secondary hyperparathyroidism of renal origin [facility])     Social History:  Social History   Socioeconomic History  . Marital status: Married    Spouse name: Not on file  . Number of children: Not on file  . Years of education: Not on file  . Highest education level: Not on file  Occupational History  . Not on file  Social Needs  . Financial resource strain: Not on file  .  Food insecurity:    Worry: Not on file    Inability: Not on file  . Transportation needs:    Medical: Not on file    Non-medical: Not on file  Tobacco Use  . Smoking status: Former Smoker    Packs/day: 0.50    Years: 10.00    Pack years: 5.00    Last attempt to quit: 07/18/1989    Years since quitting: 28.9  . Smokeless tobacco: Never Used  . Tobacco comment: 1/2 pack a day- when smoked  Substance and Sexual Activity  . Alcohol use: Yes    Alcohol/week: 2.0 standard drinks    Types: 1 Glasses of wine, 1 Cans of beer per week    Comment: one drink of either once a week  . Drug use: No   . Sexual activity: Not on file  Lifestyle  . Physical activity:    Days per week: Not on file    Minutes per session: Not on file  . Stress: Not on file  Relationships  . Social connections:    Talks on phone: Not on file    Gets together: Not on file    Attends religious service: Not on file    Active member of club or organization: Not on file    Attends meetings of clubs or organizations: Not on file    Relationship status: Not on file  . Intimate partner violence:    Fear of current or ex partner: Not on file    Emotionally abused: Not on file    Physically abused: Not on file    Forced sexual activity: Not on file  Other Topics Concern  . Not on file  Social History Narrative  . Not on file    Medications:   Current Outpatient Medications on File Prior to Visit  Medication Sig Dispense Refill  . albuterol (PROVENTIL HFA;VENTOLIN HFA) 108 (90 Base) MCG/ACT inhaler Inhale 2 puffs into the lungs every 6 (six) hours as needed for wheezing or shortness of breath. 1 Inhaler 2  . allopurinol (ZYLOPRIM) 100 MG tablet Take 100 mg by mouth daily.  5  . amLODipine (NORVASC) 10 MG tablet Take 10 mg by mouth at bedtime.   1  . aspirin 325 MG tablet Take 1 tablet (325 mg total) by mouth daily.    Marland Kitchen atorvastatin (LIPITOR) 20 MG tablet Take 20 mg by mouth daily. Reported on 01/13/2016    . calcitRIOL (ROCALTROL) 0.25 MCG capsule Take 2 capsules (0.5 mcg total) by mouth daily. Reported on 01/13/2016    . carvedilol (COREG) 12.5 MG tablet Take 12.5 mg by mouth See admin instructions. Take 12.5 mg by mouth two times a day on Sun/Mon/Wed/Fri and 12.5 mg in the evening on Tues/Thurs/Sat  1  . ferric citrate (AURYXIA) 1 GM 210 MG(Fe) tablet Take 420 mg by mouth 3 (three) times daily with meals.    Marland Kitchen labetalol (NORMODYNE) 100 MG tablet Take 100 mg by mouth 2 (two) times daily.    Marland Kitchen losartan (COZAAR) 25 MG tablet Take 1 tablet (25 mg total) by mouth daily. 30 tablet 0  . multivitamin (RENA-VIT)  TABS tablet Take 1 tablet by mouth daily.    . sevelamer carbonate (RENVELA) 800 MG tablet Take 1,600-2,400 mg by mouth See admin instructions. Take 2,400 mg by mouth three times a day with meals and 800-1,600 mg with snacks    . sodium bicarbonate 650 MG tablet Take 3 tablets (1,950 mg total) by mouth 2 (two)  times daily. Reported on 01/13/2016     No current facility-administered medications on file prior to visit.     Allergies:   Allergies  Allergen Reactions  . Bee Venom Swelling    Swells badly with bee stings (WELTS at site and lightheadedness)  . Prednisone Swelling and Other (See Comments)    Difficulty swallowing/reported by patient  . Shellfish-Derived Products Hives, Rash and Other (See Comments)    Gets light-headed, as well   . Shrimp [Shellfish Allergy] Hives and Rash    Physical Exam General: well developed, well nourished, elderly Caucasian male seated, in no evident distress Head: head normocephalic and atraumatic.   Neck: supple with no carotid or supraclavicular bruits Cardiovascular: regular rate and rhythm, no murmurs Musculoskeletal: no deformity Skin:  no rash/petichiae Vascular:  Normal pulses all extremities.  Bruit over dialysis AV fistula in left forearm  Neurologic Exam Mental Status: Awake and fully alert. Oriented to place and time. Recent and remote memory intact. Attention span, concentration and fund of knowledge appropriate. Mood and affect appropriate.  Cranial Nerves: Fundoscopic exam reveals sharp disc margins. Pupils equal, briskly reactive to light. Extraocular movements full without nystagmus. Visual fields full to confrontation. Hearing intact. Facial sensation intact. Face, tongue, palate moves normally and symmetrically.  Motor: Normal bulk and tone. Normal strength in all tested extremity muscles. Sensory.: intact to touch , pinprick , position but diminished vibratory sensation over both toes..  Coordination: Rapid alternating movements  normal in all extremities. Finger-to-nose and heel-to-shin performed accurately bilaterally. Gait and Station: Arises from chair without difficulty. Stance is normal. Gait demonstrates normal stride length and balance .  Unable to heel, toe and tandem walk   NIHSS 0 Modified Rankin 1   ASSESSMENT: 72 year old Caucasian male with transient episode of left face and body numbness and weakness likely right hemispheric TIA in Oct 2019 from small vessel disease.  Vascular risk factors of hypertension only.  Also complains of extremity paresthesias and mild tremors possibly multifactorial from underlying peripheral neuropathy versus renal failure and anxiety     PLAN: I had a long d/w patient about his recent TIA, risk for recurrent stroke/TIAs, personally independently reviewed imaging studies and stroke evaluation results and answered questions.Continue aspirin 325 mg daily  for secondary stroke prevention and maintain strict control of hypertension with blood pressure goal below 130/90, diabetes with hemoglobin A1c goal below 6.5% and lipids with LDL cholesterol goal below 70 mg/dL. I also advised the patient to eat a healthy diet with plenty of whole grains, cereals, fruits and vegetables, exercise regularly and maintain ideal body weight .  We also talked about his intermittent tingling numbness in extremities as well as mild hand tremors and discussed possible causes.  I recommend checking EMG nerve conduction study as well as neuropathy panel labs for treatable etiologies.  Greater than 50% time during this 45-minute consultation visit was spent on counseling and coordination of care about his TIA and discussion about stroke prevention and treatment and answering questions.  Followup in the future with my nurse practitioner Janett Billow in 3 months or call earlier if necessary. Antony Contras, MD  [facility] [address] [place], [place] [address]  Phone [phone]  Fax [phone] Note: This document was prepared with digital dictation and possible smart phrase technology. Any transcriptional errors that result from this process are unintentional.

## 2018-06-21 DIAGNOSIS — E611 Iron deficiency: Secondary | ICD-10-CM | POA: Diagnosis not present

## 2018-06-21 DIAGNOSIS — E875 Hyperkalemia: Secondary | ICD-10-CM | POA: Diagnosis not present

## 2018-06-21 DIAGNOSIS — N2581 Secondary hyperparathyroidism of renal origin: Secondary | ICD-10-CM | POA: Diagnosis not present

## 2018-06-21 DIAGNOSIS — N186 End stage renal disease: Secondary | ICD-10-CM | POA: Diagnosis not present

## 2018-06-22 LAB — NEUROPATHY PANEL
A/G RATIO SPE: 2 — AB (ref 0.7–1.7)
ALPHA 1: 0.2 g/dL (ref 0.0–0.4)
ALPHA 2: 0.5 g/dL (ref 0.4–1.0)
Albumin ELP: 4.2 g/dL (ref 2.9–4.4)
Angio Convert Enzyme: 47 U/L (ref 14–82)
Anti Nuclear Antibody(ANA): POSITIVE — AB
BETA: 0.8 g/dL (ref 0.7–1.3)
GAMMA GLOBULIN: 0.5 g/dL (ref 0.4–1.8)
Globulin, Total: 2.1 g/dL — ABNORMAL LOW (ref 2.2–3.9)
Rhuematoid fact SerPl-aCnc: <10 IU/mL (ref 0.0–13.9)
Sed Rate: 10 mm/hr (ref 0–30)
TSH: 3.36 u[IU]/mL (ref 0.450–4.500)
Total Protein: 6.3 g/dL (ref 6.0–8.5)
VIT D 25 HYDROXY: 17.3 ng/mL — AB (ref 30.0–100.0)
Vitamin B-12: 796 pg/mL (ref 232–1245)

## 2018-06-23 DIAGNOSIS — E611 Iron deficiency: Secondary | ICD-10-CM | POA: Diagnosis not present

## 2018-06-23 DIAGNOSIS — E875 Hyperkalemia: Secondary | ICD-10-CM | POA: Diagnosis not present

## 2018-06-23 DIAGNOSIS — N2581 Secondary hyperparathyroidism of renal origin: Secondary | ICD-10-CM | POA: Diagnosis not present

## 2018-06-23 DIAGNOSIS — N186 End stage renal disease: Secondary | ICD-10-CM | POA: Diagnosis not present

## 2018-06-26 DIAGNOSIS — N186 End stage renal disease: Secondary | ICD-10-CM | POA: Diagnosis not present

## 2018-06-26 DIAGNOSIS — E875 Hyperkalemia: Secondary | ICD-10-CM | POA: Diagnosis not present

## 2018-06-26 DIAGNOSIS — E611 Iron deficiency: Secondary | ICD-10-CM | POA: Diagnosis not present

## 2018-06-26 DIAGNOSIS — N2581 Secondary hyperparathyroidism of renal origin: Secondary | ICD-10-CM | POA: Diagnosis not present

## 2018-06-28 DIAGNOSIS — N2581 Secondary hyperparathyroidism of renal origin: Secondary | ICD-10-CM | POA: Diagnosis not present

## 2018-06-28 DIAGNOSIS — E611 Iron deficiency: Secondary | ICD-10-CM | POA: Diagnosis not present

## 2018-06-28 DIAGNOSIS — N186 End stage renal disease: Secondary | ICD-10-CM | POA: Diagnosis not present

## 2018-06-28 DIAGNOSIS — E875 Hyperkalemia: Secondary | ICD-10-CM | POA: Diagnosis not present

## 2018-06-30 DIAGNOSIS — E611 Iron deficiency: Secondary | ICD-10-CM | POA: Diagnosis not present

## 2018-06-30 DIAGNOSIS — N2581 Secondary hyperparathyroidism of renal origin: Secondary | ICD-10-CM | POA: Diagnosis not present

## 2018-06-30 DIAGNOSIS — E875 Hyperkalemia: Secondary | ICD-10-CM | POA: Diagnosis not present

## 2018-06-30 DIAGNOSIS — N186 End stage renal disease: Secondary | ICD-10-CM | POA: Diagnosis not present

## 2018-07-03 DIAGNOSIS — N186 End stage renal disease: Secondary | ICD-10-CM | POA: Diagnosis not present

## 2018-07-03 DIAGNOSIS — N2581 Secondary hyperparathyroidism of renal origin: Secondary | ICD-10-CM | POA: Diagnosis not present

## 2018-07-03 DIAGNOSIS — E875 Hyperkalemia: Secondary | ICD-10-CM | POA: Diagnosis not present

## 2018-07-03 DIAGNOSIS — E611 Iron deficiency: Secondary | ICD-10-CM | POA: Diagnosis not present

## 2018-07-04 ENCOUNTER — Telehealth: Payer: Self-pay

## 2018-07-04 NOTE — Telephone Encounter (Signed)
-----   Message from Garvin Fila, MD sent at 06/29/2018  6:16 PM EST ----- Kindly inform the patient that all lab work for reversible causes of neuropathy was normal except for low vitamin D levels and positive test for lupus which may be a nonspecific finding. Kindly advise him to see his primary physician Dr. Kenton Kingfisher to start vitamin D replacement

## 2018-07-04 NOTE — Telephone Encounter (Signed)
RN call patient that his lab work for neuropathy was normal except for low vitamin d levels. Rn stated he test positive for lupus which maybe nonspecific finding. Rn stated a copy will be fax to his Dr. Kenton Kingfisher. Rn stated per Dr. Leonie Man he wants his primary doctor to do management of vitamin d. PT verbalized understanding. Labs sent to PCP. ------

## 2018-07-04 NOTE — Telephone Encounter (Signed)
LAbs fax twice to pts primary doctor at [phone]. Dr. Leonie Man wants primary to treat pts low vitamin d levels.

## 2018-07-05 DIAGNOSIS — E875 Hyperkalemia: Secondary | ICD-10-CM | POA: Diagnosis not present

## 2018-07-05 DIAGNOSIS — E611 Iron deficiency: Secondary | ICD-10-CM | POA: Diagnosis not present

## 2018-07-05 DIAGNOSIS — N186 End stage renal disease: Secondary | ICD-10-CM | POA: Diagnosis not present

## 2018-07-05 DIAGNOSIS — N2581 Secondary hyperparathyroidism of renal origin: Secondary | ICD-10-CM | POA: Diagnosis not present

## 2018-07-07 DIAGNOSIS — E875 Hyperkalemia: Secondary | ICD-10-CM | POA: Diagnosis not present

## 2018-07-07 DIAGNOSIS — N2581 Secondary hyperparathyroidism of renal origin: Secondary | ICD-10-CM | POA: Diagnosis not present

## 2018-07-07 DIAGNOSIS — E611 Iron deficiency: Secondary | ICD-10-CM | POA: Diagnosis not present

## 2018-07-07 DIAGNOSIS — N186 End stage renal disease: Secondary | ICD-10-CM | POA: Diagnosis not present

## 2018-07-09 DIAGNOSIS — E611 Iron deficiency: Secondary | ICD-10-CM | POA: Diagnosis not present

## 2018-07-09 DIAGNOSIS — E875 Hyperkalemia: Secondary | ICD-10-CM | POA: Diagnosis not present

## 2018-07-09 DIAGNOSIS — N186 End stage renal disease: Secondary | ICD-10-CM | POA: Diagnosis not present

## 2018-07-09 DIAGNOSIS — N2581 Secondary hyperparathyroidism of renal origin: Secondary | ICD-10-CM | POA: Diagnosis not present

## 2018-07-12 DIAGNOSIS — E611 Iron deficiency: Secondary | ICD-10-CM | POA: Diagnosis not present

## 2018-07-12 DIAGNOSIS — N186 End stage renal disease: Secondary | ICD-10-CM | POA: Diagnosis not present

## 2018-07-12 DIAGNOSIS — E875 Hyperkalemia: Secondary | ICD-10-CM | POA: Diagnosis not present

## 2018-07-12 DIAGNOSIS — N2581 Secondary hyperparathyroidism of renal origin: Secondary | ICD-10-CM | POA: Diagnosis not present

## 2018-07-14 DIAGNOSIS — N2581 Secondary hyperparathyroidism of renal origin: Secondary | ICD-10-CM | POA: Diagnosis not present

## 2018-07-14 DIAGNOSIS — E611 Iron deficiency: Secondary | ICD-10-CM | POA: Diagnosis not present

## 2018-07-14 DIAGNOSIS — N186 End stage renal disease: Secondary | ICD-10-CM | POA: Diagnosis not present

## 2018-07-14 DIAGNOSIS — E875 Hyperkalemia: Secondary | ICD-10-CM | POA: Diagnosis not present

## 2018-07-16 DIAGNOSIS — E875 Hyperkalemia: Secondary | ICD-10-CM | POA: Diagnosis not present

## 2018-07-16 DIAGNOSIS — E611 Iron deficiency: Secondary | ICD-10-CM | POA: Diagnosis not present

## 2018-07-16 DIAGNOSIS — N2581 Secondary hyperparathyroidism of renal origin: Secondary | ICD-10-CM | POA: Diagnosis not present

## 2018-07-16 DIAGNOSIS — N186 End stage renal disease: Secondary | ICD-10-CM | POA: Diagnosis not present

## 2018-07-18 DIAGNOSIS — N186 End stage renal disease: Secondary | ICD-10-CM | POA: Diagnosis not present

## 2018-07-18 DIAGNOSIS — N049 Nephrotic syndrome with unspecified morphologic changes: Secondary | ICD-10-CM | POA: Diagnosis not present

## 2018-07-18 DIAGNOSIS — Z992 Dependence on renal dialysis: Secondary | ICD-10-CM | POA: Diagnosis not present

## 2018-07-19 DIAGNOSIS — E611 Iron deficiency: Secondary | ICD-10-CM | POA: Diagnosis not present

## 2018-07-19 DIAGNOSIS — D631 Anemia in chronic kidney disease: Secondary | ICD-10-CM | POA: Diagnosis not present

## 2018-07-19 DIAGNOSIS — N186 End stage renal disease: Secondary | ICD-10-CM | POA: Diagnosis not present

## 2018-07-19 DIAGNOSIS — N2581 Secondary hyperparathyroidism of renal origin: Secondary | ICD-10-CM | POA: Diagnosis not present

## 2018-07-21 DIAGNOSIS — N2581 Secondary hyperparathyroidism of renal origin: Secondary | ICD-10-CM | POA: Diagnosis not present

## 2018-07-21 DIAGNOSIS — D631 Anemia in chronic kidney disease: Secondary | ICD-10-CM | POA: Diagnosis not present

## 2018-07-21 DIAGNOSIS — N186 End stage renal disease: Secondary | ICD-10-CM | POA: Diagnosis not present

## 2018-07-21 DIAGNOSIS — E611 Iron deficiency: Secondary | ICD-10-CM | POA: Diagnosis not present

## 2018-07-24 DIAGNOSIS — N186 End stage renal disease: Secondary | ICD-10-CM | POA: Diagnosis not present

## 2018-07-24 DIAGNOSIS — N2581 Secondary hyperparathyroidism of renal origin: Secondary | ICD-10-CM | POA: Diagnosis not present

## 2018-07-24 DIAGNOSIS — E611 Iron deficiency: Secondary | ICD-10-CM | POA: Diagnosis not present

## 2018-07-24 DIAGNOSIS — D631 Anemia in chronic kidney disease: Secondary | ICD-10-CM | POA: Diagnosis not present

## 2018-07-26 DIAGNOSIS — E611 Iron deficiency: Secondary | ICD-10-CM | POA: Diagnosis not present

## 2018-07-26 DIAGNOSIS — N2581 Secondary hyperparathyroidism of renal origin: Secondary | ICD-10-CM | POA: Diagnosis not present

## 2018-07-26 DIAGNOSIS — N186 End stage renal disease: Secondary | ICD-10-CM | POA: Diagnosis not present

## 2018-07-26 DIAGNOSIS — D631 Anemia in chronic kidney disease: Secondary | ICD-10-CM | POA: Diagnosis not present

## 2018-07-28 DIAGNOSIS — D631 Anemia in chronic kidney disease: Secondary | ICD-10-CM | POA: Diagnosis not present

## 2018-07-28 DIAGNOSIS — N2581 Secondary hyperparathyroidism of renal origin: Secondary | ICD-10-CM | POA: Diagnosis not present

## 2018-07-28 DIAGNOSIS — N186 End stage renal disease: Secondary | ICD-10-CM | POA: Diagnosis not present

## 2018-07-28 DIAGNOSIS — E611 Iron deficiency: Secondary | ICD-10-CM | POA: Diagnosis not present

## 2018-07-31 DIAGNOSIS — D631 Anemia in chronic kidney disease: Secondary | ICD-10-CM | POA: Diagnosis not present

## 2018-07-31 DIAGNOSIS — N2581 Secondary hyperparathyroidism of renal origin: Secondary | ICD-10-CM | POA: Diagnosis not present

## 2018-07-31 DIAGNOSIS — E611 Iron deficiency: Secondary | ICD-10-CM | POA: Diagnosis not present

## 2018-07-31 DIAGNOSIS — N186 End stage renal disease: Secondary | ICD-10-CM | POA: Diagnosis not present

## 2018-08-02 DIAGNOSIS — E611 Iron deficiency: Secondary | ICD-10-CM | POA: Diagnosis not present

## 2018-08-02 DIAGNOSIS — D631 Anemia in chronic kidney disease: Secondary | ICD-10-CM | POA: Diagnosis not present

## 2018-08-02 DIAGNOSIS — N2581 Secondary hyperparathyroidism of renal origin: Secondary | ICD-10-CM | POA: Diagnosis not present

## 2018-08-02 DIAGNOSIS — N186 End stage renal disease: Secondary | ICD-10-CM | POA: Diagnosis not present

## 2018-08-03 ENCOUNTER — Encounter: Payer: BLUE CROSS/BLUE SHIELD | Admitting: Neurology

## 2018-08-03 ENCOUNTER — Telehealth: Payer: Self-pay | Admitting: Pulmonary Disease

## 2018-08-03 NOTE — Telephone Encounter (Signed)
Called and spoke with patient, he stated that he thinks he is doing much better and he does not want to come back in for his PFT or his OV. Patient stated that all symptoms have subsided and he does not want to come in. Nothing further needed will route this over to [place] as an FYI.

## 2018-08-03 NOTE — Telephone Encounter (Signed)
Called patient about the scheduling conflict. Patient thanked me for calling because he was wanting to cancel the PFT and Office visit because he felt it was no longer needed due to how he was feeling.Appointment was canceled with no reschedule///fmb

## 2018-08-04 DIAGNOSIS — E611 Iron deficiency: Secondary | ICD-10-CM | POA: Diagnosis not present

## 2018-08-04 DIAGNOSIS — N186 End stage renal disease: Secondary | ICD-10-CM | POA: Diagnosis not present

## 2018-08-04 DIAGNOSIS — N2581 Secondary hyperparathyroidism of renal origin: Secondary | ICD-10-CM | POA: Diagnosis not present

## 2018-08-04 DIAGNOSIS — D631 Anemia in chronic kidney disease: Secondary | ICD-10-CM | POA: Diagnosis not present

## 2018-08-06 ENCOUNTER — Encounter: Payer: Self-pay | Admitting: Neurology

## 2018-08-07 DIAGNOSIS — N2581 Secondary hyperparathyroidism of renal origin: Secondary | ICD-10-CM | POA: Diagnosis not present

## 2018-08-07 DIAGNOSIS — D631 Anemia in chronic kidney disease: Secondary | ICD-10-CM | POA: Diagnosis not present

## 2018-08-07 DIAGNOSIS — E611 Iron deficiency: Secondary | ICD-10-CM | POA: Diagnosis not present

## 2018-08-07 DIAGNOSIS — N186 End stage renal disease: Secondary | ICD-10-CM | POA: Diagnosis not present

## 2018-08-09 DIAGNOSIS — E611 Iron deficiency: Secondary | ICD-10-CM | POA: Diagnosis not present

## 2018-08-09 DIAGNOSIS — D631 Anemia in chronic kidney disease: Secondary | ICD-10-CM | POA: Diagnosis not present

## 2018-08-09 DIAGNOSIS — N2581 Secondary hyperparathyroidism of renal origin: Secondary | ICD-10-CM | POA: Diagnosis not present

## 2018-08-09 DIAGNOSIS — N186 End stage renal disease: Secondary | ICD-10-CM | POA: Diagnosis not present

## 2018-08-11 DIAGNOSIS — E611 Iron deficiency: Secondary | ICD-10-CM | POA: Diagnosis not present

## 2018-08-11 DIAGNOSIS — N186 End stage renal disease: Secondary | ICD-10-CM | POA: Diagnosis not present

## 2018-08-11 DIAGNOSIS — D631 Anemia in chronic kidney disease: Secondary | ICD-10-CM | POA: Diagnosis not present

## 2018-08-11 DIAGNOSIS — N2581 Secondary hyperparathyroidism of renal origin: Secondary | ICD-10-CM | POA: Diagnosis not present

## 2018-08-14 DIAGNOSIS — N186 End stage renal disease: Secondary | ICD-10-CM | POA: Diagnosis not present

## 2018-08-14 DIAGNOSIS — E611 Iron deficiency: Secondary | ICD-10-CM | POA: Diagnosis not present

## 2018-08-14 DIAGNOSIS — D631 Anemia in chronic kidney disease: Secondary | ICD-10-CM | POA: Diagnosis not present

## 2018-08-14 DIAGNOSIS — N2581 Secondary hyperparathyroidism of renal origin: Secondary | ICD-10-CM | POA: Diagnosis not present

## 2018-08-16 DIAGNOSIS — D631 Anemia in chronic kidney disease: Secondary | ICD-10-CM | POA: Diagnosis not present

## 2018-08-16 DIAGNOSIS — N186 End stage renal disease: Secondary | ICD-10-CM | POA: Diagnosis not present

## 2018-08-16 DIAGNOSIS — E611 Iron deficiency: Secondary | ICD-10-CM | POA: Diagnosis not present

## 2018-08-16 DIAGNOSIS — N2581 Secondary hyperparathyroidism of renal origin: Secondary | ICD-10-CM | POA: Diagnosis not present

## 2018-08-18 DIAGNOSIS — E611 Iron deficiency: Secondary | ICD-10-CM | POA: Diagnosis not present

## 2018-08-18 DIAGNOSIS — Z992 Dependence on renal dialysis: Secondary | ICD-10-CM | POA: Diagnosis not present

## 2018-08-18 DIAGNOSIS — N186 End stage renal disease: Secondary | ICD-10-CM | POA: Diagnosis not present

## 2018-08-18 DIAGNOSIS — N2581 Secondary hyperparathyroidism of renal origin: Secondary | ICD-10-CM | POA: Diagnosis not present

## 2018-08-18 DIAGNOSIS — N049 Nephrotic syndrome with unspecified morphologic changes: Secondary | ICD-10-CM | POA: Diagnosis not present

## 2018-08-21 DIAGNOSIS — N186 End stage renal disease: Secondary | ICD-10-CM | POA: Diagnosis not present

## 2018-08-21 DIAGNOSIS — E611 Iron deficiency: Secondary | ICD-10-CM | POA: Diagnosis not present

## 2018-08-21 DIAGNOSIS — N2581 Secondary hyperparathyroidism of renal origin: Secondary | ICD-10-CM | POA: Diagnosis not present

## 2018-08-22 ENCOUNTER — Ambulatory Visit: Payer: BLUE CROSS/BLUE SHIELD | Admitting: Pulmonary Disease

## 2018-08-22 ENCOUNTER — Encounter: Payer: BLUE CROSS/BLUE SHIELD | Admitting: Pulmonary Disease

## 2018-08-23 DIAGNOSIS — N2581 Secondary hyperparathyroidism of renal origin: Secondary | ICD-10-CM | POA: Diagnosis not present

## 2018-08-23 DIAGNOSIS — E611 Iron deficiency: Secondary | ICD-10-CM | POA: Diagnosis not present

## 2018-08-23 DIAGNOSIS — N186 End stage renal disease: Secondary | ICD-10-CM | POA: Diagnosis not present

## 2018-08-25 DIAGNOSIS — N2581 Secondary hyperparathyroidism of renal origin: Secondary | ICD-10-CM | POA: Diagnosis not present

## 2018-08-25 DIAGNOSIS — E611 Iron deficiency: Secondary | ICD-10-CM | POA: Diagnosis not present

## 2018-08-25 DIAGNOSIS — N186 End stage renal disease: Secondary | ICD-10-CM | POA: Diagnosis not present

## 2018-08-28 ENCOUNTER — Telehealth: Payer: Self-pay | Admitting: Adult Health

## 2018-08-28 DIAGNOSIS — E611 Iron deficiency: Secondary | ICD-10-CM | POA: Diagnosis not present

## 2018-08-28 DIAGNOSIS — N186 End stage renal disease: Secondary | ICD-10-CM | POA: Diagnosis not present

## 2018-08-28 DIAGNOSIS — N2581 Secondary hyperparathyroidism of renal origin: Secondary | ICD-10-CM | POA: Diagnosis not present

## 2018-08-28 NOTE — Telephone Encounter (Signed)
Called patient to r/s his 09/19/18 appt due to Janett Billow being out, patient asked me what the appointment was for and I explained that it is a follow-up. Patient then stated that he does not feel the need to be seen at this time. Appointment was cancelled.

## 2018-08-30 DIAGNOSIS — N049 Nephrotic syndrome with unspecified morphologic changes: Secondary | ICD-10-CM | POA: Diagnosis not present

## 2018-08-30 DIAGNOSIS — N186 End stage renal disease: Secondary | ICD-10-CM | POA: Diagnosis not present

## 2018-09-01 DIAGNOSIS — N186 End stage renal disease: Secondary | ICD-10-CM | POA: Diagnosis not present

## 2018-09-01 DIAGNOSIS — N049 Nephrotic syndrome with unspecified morphologic changes: Secondary | ICD-10-CM | POA: Diagnosis not present

## 2018-09-04 DIAGNOSIS — N186 End stage renal disease: Secondary | ICD-10-CM | POA: Diagnosis not present

## 2018-09-04 DIAGNOSIS — N2581 Secondary hyperparathyroidism of renal origin: Secondary | ICD-10-CM | POA: Diagnosis not present

## 2018-09-04 DIAGNOSIS — E611 Iron deficiency: Secondary | ICD-10-CM | POA: Diagnosis not present

## 2018-09-06 DIAGNOSIS — E611 Iron deficiency: Secondary | ICD-10-CM | POA: Diagnosis not present

## 2018-09-06 DIAGNOSIS — N186 End stage renal disease: Secondary | ICD-10-CM | POA: Diagnosis not present

## 2018-09-06 DIAGNOSIS — N2581 Secondary hyperparathyroidism of renal origin: Secondary | ICD-10-CM | POA: Diagnosis not present

## 2018-09-08 DIAGNOSIS — N2581 Secondary hyperparathyroidism of renal origin: Secondary | ICD-10-CM | POA: Diagnosis not present

## 2018-09-08 DIAGNOSIS — N186 End stage renal disease: Secondary | ICD-10-CM | POA: Diagnosis not present

## 2018-09-08 DIAGNOSIS — E611 Iron deficiency: Secondary | ICD-10-CM | POA: Diagnosis not present

## 2018-09-11 DIAGNOSIS — N2581 Secondary hyperparathyroidism of renal origin: Secondary | ICD-10-CM | POA: Diagnosis not present

## 2018-09-11 DIAGNOSIS — E611 Iron deficiency: Secondary | ICD-10-CM | POA: Diagnosis not present

## 2018-09-11 DIAGNOSIS — N186 End stage renal disease: Secondary | ICD-10-CM | POA: Diagnosis not present

## 2018-09-13 DIAGNOSIS — N2581 Secondary hyperparathyroidism of renal origin: Secondary | ICD-10-CM | POA: Diagnosis not present

## 2018-09-13 DIAGNOSIS — N186 End stage renal disease: Secondary | ICD-10-CM | POA: Diagnosis not present

## 2018-09-13 DIAGNOSIS — E611 Iron deficiency: Secondary | ICD-10-CM | POA: Diagnosis not present

## 2018-09-15 DIAGNOSIS — E611 Iron deficiency: Secondary | ICD-10-CM | POA: Diagnosis not present

## 2018-09-15 DIAGNOSIS — N186 End stage renal disease: Secondary | ICD-10-CM | POA: Diagnosis not present

## 2018-09-15 DIAGNOSIS — N2581 Secondary hyperparathyroidism of renal origin: Secondary | ICD-10-CM | POA: Diagnosis not present

## 2018-09-16 DIAGNOSIS — N186 End stage renal disease: Secondary | ICD-10-CM | POA: Diagnosis not present

## 2018-09-16 DIAGNOSIS — N049 Nephrotic syndrome with unspecified morphologic changes: Secondary | ICD-10-CM | POA: Diagnosis not present

## 2018-09-16 DIAGNOSIS — Z992 Dependence on renal dialysis: Secondary | ICD-10-CM | POA: Diagnosis not present

## 2018-09-18 DIAGNOSIS — Z992 Dependence on renal dialysis: Secondary | ICD-10-CM | POA: Diagnosis not present

## 2018-09-18 DIAGNOSIS — E785 Hyperlipidemia, unspecified: Secondary | ICD-10-CM | POA: Diagnosis not present

## 2018-09-18 DIAGNOSIS — Z7682 Awaiting organ transplant status: Secondary | ICD-10-CM | POA: Diagnosis not present

## 2018-09-18 DIAGNOSIS — R9431 Abnormal electrocardiogram [ECG] [EKG]: Secondary | ICD-10-CM | POA: Diagnosis not present

## 2018-09-18 DIAGNOSIS — R001 Bradycardia, unspecified: Secondary | ICD-10-CM | POA: Diagnosis not present

## 2018-09-18 DIAGNOSIS — K219 Gastro-esophageal reflux disease without esophagitis: Secondary | ICD-10-CM | POA: Diagnosis not present

## 2018-09-18 DIAGNOSIS — N185 Chronic kidney disease, stage 5: Secondary | ICD-10-CM | POA: Diagnosis not present

## 2018-09-18 DIAGNOSIS — I120 Hypertensive chronic kidney disease with stage 5 chronic kidney disease or end stage renal disease: Secondary | ICD-10-CM | POA: Diagnosis not present

## 2018-09-18 DIAGNOSIS — I517 Cardiomegaly: Secondary | ICD-10-CM | POA: Diagnosis not present

## 2018-09-18 DIAGNOSIS — I4510 Unspecified right bundle-branch block: Secondary | ICD-10-CM | POA: Diagnosis not present

## 2018-09-19 ENCOUNTER — Ambulatory Visit: Payer: Medicare Other | Admitting: Adult Health

## 2018-09-20 DIAGNOSIS — N2581 Secondary hyperparathyroidism of renal origin: Secondary | ICD-10-CM | POA: Diagnosis not present

## 2018-09-20 DIAGNOSIS — E875 Hyperkalemia: Secondary | ICD-10-CM | POA: Diagnosis not present

## 2018-09-20 DIAGNOSIS — D631 Anemia in chronic kidney disease: Secondary | ICD-10-CM | POA: Diagnosis not present

## 2018-09-20 DIAGNOSIS — N186 End stage renal disease: Secondary | ICD-10-CM | POA: Diagnosis not present

## 2018-09-22 DIAGNOSIS — D631 Anemia in chronic kidney disease: Secondary | ICD-10-CM | POA: Diagnosis not present

## 2018-09-22 DIAGNOSIS — N2581 Secondary hyperparathyroidism of renal origin: Secondary | ICD-10-CM | POA: Diagnosis not present

## 2018-09-22 DIAGNOSIS — N186 End stage renal disease: Secondary | ICD-10-CM | POA: Diagnosis not present

## 2018-09-22 DIAGNOSIS — E875 Hyperkalemia: Secondary | ICD-10-CM | POA: Diagnosis not present

## 2018-09-25 DIAGNOSIS — N186 End stage renal disease: Secondary | ICD-10-CM | POA: Diagnosis not present

## 2018-09-25 DIAGNOSIS — N2581 Secondary hyperparathyroidism of renal origin: Secondary | ICD-10-CM | POA: Diagnosis not present

## 2018-09-25 DIAGNOSIS — D631 Anemia in chronic kidney disease: Secondary | ICD-10-CM | POA: Diagnosis not present

## 2018-09-25 DIAGNOSIS — E875 Hyperkalemia: Secondary | ICD-10-CM | POA: Diagnosis not present

## 2018-09-27 DIAGNOSIS — N186 End stage renal disease: Secondary | ICD-10-CM | POA: Diagnosis not present

## 2018-09-27 DIAGNOSIS — N2581 Secondary hyperparathyroidism of renal origin: Secondary | ICD-10-CM | POA: Diagnosis not present

## 2018-09-27 DIAGNOSIS — D631 Anemia in chronic kidney disease: Secondary | ICD-10-CM | POA: Diagnosis not present

## 2018-09-27 DIAGNOSIS — E875 Hyperkalemia: Secondary | ICD-10-CM | POA: Diagnosis not present

## 2018-09-29 DIAGNOSIS — N186 End stage renal disease: Secondary | ICD-10-CM | POA: Diagnosis not present

## 2018-09-29 DIAGNOSIS — D631 Anemia in chronic kidney disease: Secondary | ICD-10-CM | POA: Diagnosis not present

## 2018-09-29 DIAGNOSIS — E875 Hyperkalemia: Secondary | ICD-10-CM | POA: Diagnosis not present

## 2018-09-29 DIAGNOSIS — N2581 Secondary hyperparathyroidism of renal origin: Secondary | ICD-10-CM | POA: Diagnosis not present

## 2018-10-02 DIAGNOSIS — E875 Hyperkalemia: Secondary | ICD-10-CM | POA: Diagnosis not present

## 2018-10-02 DIAGNOSIS — N186 End stage renal disease: Secondary | ICD-10-CM | POA: Diagnosis not present

## 2018-10-02 DIAGNOSIS — N2581 Secondary hyperparathyroidism of renal origin: Secondary | ICD-10-CM | POA: Diagnosis not present

## 2018-10-02 DIAGNOSIS — D631 Anemia in chronic kidney disease: Secondary | ICD-10-CM | POA: Diagnosis not present

## 2018-10-04 DIAGNOSIS — N186 End stage renal disease: Secondary | ICD-10-CM | POA: Diagnosis not present

## 2018-10-04 DIAGNOSIS — N2581 Secondary hyperparathyroidism of renal origin: Secondary | ICD-10-CM | POA: Diagnosis not present

## 2018-10-04 DIAGNOSIS — E875 Hyperkalemia: Secondary | ICD-10-CM | POA: Diagnosis not present

## 2018-10-04 DIAGNOSIS — D631 Anemia in chronic kidney disease: Secondary | ICD-10-CM | POA: Diagnosis not present

## 2018-10-06 DIAGNOSIS — N186 End stage renal disease: Secondary | ICD-10-CM | POA: Diagnosis not present

## 2018-10-06 DIAGNOSIS — E875 Hyperkalemia: Secondary | ICD-10-CM | POA: Diagnosis not present

## 2018-10-06 DIAGNOSIS — N2581 Secondary hyperparathyroidism of renal origin: Secondary | ICD-10-CM | POA: Diagnosis not present

## 2018-10-06 DIAGNOSIS — D631 Anemia in chronic kidney disease: Secondary | ICD-10-CM | POA: Diagnosis not present

## 2018-10-09 DIAGNOSIS — E875 Hyperkalemia: Secondary | ICD-10-CM | POA: Diagnosis not present

## 2018-10-09 DIAGNOSIS — N2581 Secondary hyperparathyroidism of renal origin: Secondary | ICD-10-CM | POA: Diagnosis not present

## 2018-10-09 DIAGNOSIS — N186 End stage renal disease: Secondary | ICD-10-CM | POA: Diagnosis not present

## 2018-10-09 DIAGNOSIS — D631 Anemia in chronic kidney disease: Secondary | ICD-10-CM | POA: Diagnosis not present

## 2018-10-11 DIAGNOSIS — D631 Anemia in chronic kidney disease: Secondary | ICD-10-CM | POA: Diagnosis not present

## 2018-10-11 DIAGNOSIS — N186 End stage renal disease: Secondary | ICD-10-CM | POA: Diagnosis not present

## 2018-10-11 DIAGNOSIS — E875 Hyperkalemia: Secondary | ICD-10-CM | POA: Diagnosis not present

## 2018-10-11 DIAGNOSIS — N2581 Secondary hyperparathyroidism of renal origin: Secondary | ICD-10-CM | POA: Diagnosis not present

## 2018-10-13 DIAGNOSIS — E875 Hyperkalemia: Secondary | ICD-10-CM | POA: Diagnosis not present

## 2018-10-13 DIAGNOSIS — N186 End stage renal disease: Secondary | ICD-10-CM | POA: Diagnosis not present

## 2018-10-13 DIAGNOSIS — N2581 Secondary hyperparathyroidism of renal origin: Secondary | ICD-10-CM | POA: Diagnosis not present

## 2018-10-13 DIAGNOSIS — D631 Anemia in chronic kidney disease: Secondary | ICD-10-CM | POA: Diagnosis not present

## 2018-10-16 DIAGNOSIS — N2581 Secondary hyperparathyroidism of renal origin: Secondary | ICD-10-CM | POA: Diagnosis not present

## 2018-10-16 DIAGNOSIS — D631 Anemia in chronic kidney disease: Secondary | ICD-10-CM | POA: Diagnosis not present

## 2018-10-16 DIAGNOSIS — N186 End stage renal disease: Secondary | ICD-10-CM | POA: Diagnosis not present

## 2018-10-16 DIAGNOSIS — E875 Hyperkalemia: Secondary | ICD-10-CM | POA: Diagnosis not present

## 2018-10-17 DIAGNOSIS — N049 Nephrotic syndrome with unspecified morphologic changes: Secondary | ICD-10-CM | POA: Diagnosis not present

## 2018-10-17 DIAGNOSIS — Z992 Dependence on renal dialysis: Secondary | ICD-10-CM | POA: Diagnosis not present

## 2018-10-17 DIAGNOSIS — N186 End stage renal disease: Secondary | ICD-10-CM | POA: Diagnosis not present

## 2018-10-18 DIAGNOSIS — E875 Hyperkalemia: Secondary | ICD-10-CM | POA: Diagnosis not present

## 2018-10-18 DIAGNOSIS — D631 Anemia in chronic kidney disease: Secondary | ICD-10-CM | POA: Diagnosis not present

## 2018-10-18 DIAGNOSIS — N2581 Secondary hyperparathyroidism of renal origin: Secondary | ICD-10-CM | POA: Diagnosis not present

## 2018-10-18 DIAGNOSIS — N186 End stage renal disease: Secondary | ICD-10-CM | POA: Diagnosis not present

## 2018-10-20 DIAGNOSIS — N186 End stage renal disease: Secondary | ICD-10-CM | POA: Diagnosis not present

## 2018-10-20 DIAGNOSIS — D631 Anemia in chronic kidney disease: Secondary | ICD-10-CM | POA: Diagnosis not present

## 2018-10-20 DIAGNOSIS — E875 Hyperkalemia: Secondary | ICD-10-CM | POA: Diagnosis not present

## 2018-10-20 DIAGNOSIS — N2581 Secondary hyperparathyroidism of renal origin: Secondary | ICD-10-CM | POA: Diagnosis not present

## 2018-10-23 DIAGNOSIS — N2581 Secondary hyperparathyroidism of renal origin: Secondary | ICD-10-CM | POA: Diagnosis not present

## 2018-10-23 DIAGNOSIS — N186 End stage renal disease: Secondary | ICD-10-CM | POA: Diagnosis not present

## 2018-10-23 DIAGNOSIS — D631 Anemia in chronic kidney disease: Secondary | ICD-10-CM | POA: Diagnosis not present

## 2018-10-23 DIAGNOSIS — E875 Hyperkalemia: Secondary | ICD-10-CM | POA: Diagnosis not present

## 2018-10-25 DIAGNOSIS — N186 End stage renal disease: Secondary | ICD-10-CM | POA: Diagnosis not present

## 2018-10-25 DIAGNOSIS — N2581 Secondary hyperparathyroidism of renal origin: Secondary | ICD-10-CM | POA: Diagnosis not present

## 2018-10-25 DIAGNOSIS — D631 Anemia in chronic kidney disease: Secondary | ICD-10-CM | POA: Diagnosis not present

## 2018-10-25 DIAGNOSIS — E875 Hyperkalemia: Secondary | ICD-10-CM | POA: Diagnosis not present

## 2018-10-27 DIAGNOSIS — E875 Hyperkalemia: Secondary | ICD-10-CM | POA: Diagnosis not present

## 2018-10-27 DIAGNOSIS — D631 Anemia in chronic kidney disease: Secondary | ICD-10-CM | POA: Diagnosis not present

## 2018-10-27 DIAGNOSIS — N186 End stage renal disease: Secondary | ICD-10-CM | POA: Diagnosis not present

## 2018-10-27 DIAGNOSIS — N2581 Secondary hyperparathyroidism of renal origin: Secondary | ICD-10-CM | POA: Diagnosis not present

## 2018-10-30 DIAGNOSIS — D631 Anemia in chronic kidney disease: Secondary | ICD-10-CM | POA: Diagnosis not present

## 2018-10-30 DIAGNOSIS — N2581 Secondary hyperparathyroidism of renal origin: Secondary | ICD-10-CM | POA: Diagnosis not present

## 2018-10-30 DIAGNOSIS — E875 Hyperkalemia: Secondary | ICD-10-CM | POA: Diagnosis not present

## 2018-10-30 DIAGNOSIS — N186 End stage renal disease: Secondary | ICD-10-CM | POA: Diagnosis not present

## 2018-11-01 DIAGNOSIS — N2581 Secondary hyperparathyroidism of renal origin: Secondary | ICD-10-CM | POA: Diagnosis not present

## 2018-11-01 DIAGNOSIS — N186 End stage renal disease: Secondary | ICD-10-CM | POA: Diagnosis not present

## 2018-11-01 DIAGNOSIS — E875 Hyperkalemia: Secondary | ICD-10-CM | POA: Diagnosis not present

## 2018-11-01 DIAGNOSIS — D631 Anemia in chronic kidney disease: Secondary | ICD-10-CM | POA: Diagnosis not present

## 2018-11-03 DIAGNOSIS — N2581 Secondary hyperparathyroidism of renal origin: Secondary | ICD-10-CM | POA: Diagnosis not present

## 2018-11-03 DIAGNOSIS — D631 Anemia in chronic kidney disease: Secondary | ICD-10-CM | POA: Diagnosis not present

## 2018-11-03 DIAGNOSIS — N186 End stage renal disease: Secondary | ICD-10-CM | POA: Diagnosis not present

## 2018-11-03 DIAGNOSIS — E875 Hyperkalemia: Secondary | ICD-10-CM | POA: Diagnosis not present

## 2018-11-06 DIAGNOSIS — N186 End stage renal disease: Secondary | ICD-10-CM | POA: Diagnosis not present

## 2018-11-06 DIAGNOSIS — E875 Hyperkalemia: Secondary | ICD-10-CM | POA: Diagnosis not present

## 2018-11-06 DIAGNOSIS — D631 Anemia in chronic kidney disease: Secondary | ICD-10-CM | POA: Diagnosis not present

## 2018-11-06 DIAGNOSIS — N2581 Secondary hyperparathyroidism of renal origin: Secondary | ICD-10-CM | POA: Diagnosis not present

## 2018-11-08 DIAGNOSIS — N186 End stage renal disease: Secondary | ICD-10-CM | POA: Diagnosis not present

## 2018-11-08 DIAGNOSIS — E875 Hyperkalemia: Secondary | ICD-10-CM | POA: Diagnosis not present

## 2018-11-08 DIAGNOSIS — D631 Anemia in chronic kidney disease: Secondary | ICD-10-CM | POA: Diagnosis not present

## 2018-11-08 DIAGNOSIS — N2581 Secondary hyperparathyroidism of renal origin: Secondary | ICD-10-CM | POA: Diagnosis not present

## 2018-11-10 DIAGNOSIS — N186 End stage renal disease: Secondary | ICD-10-CM | POA: Diagnosis not present

## 2018-11-10 DIAGNOSIS — N2581 Secondary hyperparathyroidism of renal origin: Secondary | ICD-10-CM | POA: Diagnosis not present

## 2018-11-10 DIAGNOSIS — E875 Hyperkalemia: Secondary | ICD-10-CM | POA: Diagnosis not present

## 2018-11-10 DIAGNOSIS — D631 Anemia in chronic kidney disease: Secondary | ICD-10-CM | POA: Diagnosis not present

## 2018-11-13 DIAGNOSIS — N186 End stage renal disease: Secondary | ICD-10-CM | POA: Diagnosis not present

## 2018-11-13 DIAGNOSIS — N2581 Secondary hyperparathyroidism of renal origin: Secondary | ICD-10-CM | POA: Diagnosis not present

## 2018-11-13 DIAGNOSIS — E875 Hyperkalemia: Secondary | ICD-10-CM | POA: Diagnosis not present

## 2018-11-13 DIAGNOSIS — D631 Anemia in chronic kidney disease: Secondary | ICD-10-CM | POA: Diagnosis not present

## 2018-11-15 DIAGNOSIS — D631 Anemia in chronic kidney disease: Secondary | ICD-10-CM | POA: Diagnosis not present

## 2018-11-15 DIAGNOSIS — E875 Hyperkalemia: Secondary | ICD-10-CM | POA: Diagnosis not present

## 2018-11-15 DIAGNOSIS — N186 End stage renal disease: Secondary | ICD-10-CM | POA: Diagnosis not present

## 2018-11-15 DIAGNOSIS — N2581 Secondary hyperparathyroidism of renal origin: Secondary | ICD-10-CM | POA: Diagnosis not present

## 2018-11-16 DIAGNOSIS — Z992 Dependence on renal dialysis: Secondary | ICD-10-CM | POA: Diagnosis not present

## 2018-11-16 DIAGNOSIS — N186 End stage renal disease: Secondary | ICD-10-CM | POA: Diagnosis not present

## 2018-11-16 DIAGNOSIS — N049 Nephrotic syndrome with unspecified morphologic changes: Secondary | ICD-10-CM | POA: Diagnosis not present

## 2018-11-17 DIAGNOSIS — E875 Hyperkalemia: Secondary | ICD-10-CM | POA: Diagnosis not present

## 2018-11-17 DIAGNOSIS — Z23 Encounter for immunization: Secondary | ICD-10-CM | POA: Diagnosis not present

## 2018-11-17 DIAGNOSIS — N2581 Secondary hyperparathyroidism of renal origin: Secondary | ICD-10-CM | POA: Diagnosis not present

## 2018-11-17 DIAGNOSIS — N186 End stage renal disease: Secondary | ICD-10-CM | POA: Diagnosis not present

## 2018-11-20 DIAGNOSIS — Z23 Encounter for immunization: Secondary | ICD-10-CM | POA: Diagnosis not present

## 2018-11-20 DIAGNOSIS — E875 Hyperkalemia: Secondary | ICD-10-CM | POA: Diagnosis not present

## 2018-11-20 DIAGNOSIS — N2581 Secondary hyperparathyroidism of renal origin: Secondary | ICD-10-CM | POA: Diagnosis not present

## 2018-11-20 DIAGNOSIS — N186 End stage renal disease: Secondary | ICD-10-CM | POA: Diagnosis not present

## 2018-11-22 DIAGNOSIS — N186 End stage renal disease: Secondary | ICD-10-CM | POA: Diagnosis not present

## 2018-11-22 DIAGNOSIS — N2581 Secondary hyperparathyroidism of renal origin: Secondary | ICD-10-CM | POA: Diagnosis not present

## 2018-11-22 DIAGNOSIS — Z23 Encounter for immunization: Secondary | ICD-10-CM | POA: Diagnosis not present

## 2018-11-22 DIAGNOSIS — E875 Hyperkalemia: Secondary | ICD-10-CM | POA: Diagnosis not present

## 2018-11-24 DIAGNOSIS — N186 End stage renal disease: Secondary | ICD-10-CM | POA: Diagnosis not present

## 2018-11-24 DIAGNOSIS — N2581 Secondary hyperparathyroidism of renal origin: Secondary | ICD-10-CM | POA: Diagnosis not present

## 2018-11-24 DIAGNOSIS — Z23 Encounter for immunization: Secondary | ICD-10-CM | POA: Diagnosis not present

## 2018-11-24 DIAGNOSIS — E875 Hyperkalemia: Secondary | ICD-10-CM | POA: Diagnosis not present

## 2018-11-27 DIAGNOSIS — E875 Hyperkalemia: Secondary | ICD-10-CM | POA: Diagnosis not present

## 2018-11-27 DIAGNOSIS — Z23 Encounter for immunization: Secondary | ICD-10-CM | POA: Diagnosis not present

## 2018-11-27 DIAGNOSIS — N2581 Secondary hyperparathyroidism of renal origin: Secondary | ICD-10-CM | POA: Diagnosis not present

## 2018-11-27 DIAGNOSIS — N186 End stage renal disease: Secondary | ICD-10-CM | POA: Diagnosis not present

## 2018-11-29 DIAGNOSIS — N186 End stage renal disease: Secondary | ICD-10-CM | POA: Diagnosis not present

## 2018-11-29 DIAGNOSIS — E875 Hyperkalemia: Secondary | ICD-10-CM | POA: Diagnosis not present

## 2018-11-29 DIAGNOSIS — N2581 Secondary hyperparathyroidism of renal origin: Secondary | ICD-10-CM | POA: Diagnosis not present

## 2018-11-29 DIAGNOSIS — Z23 Encounter for immunization: Secondary | ICD-10-CM | POA: Diagnosis not present

## 2018-12-01 DIAGNOSIS — E875 Hyperkalemia: Secondary | ICD-10-CM | POA: Diagnosis not present

## 2018-12-01 DIAGNOSIS — N2581 Secondary hyperparathyroidism of renal origin: Secondary | ICD-10-CM | POA: Diagnosis not present

## 2018-12-01 DIAGNOSIS — Z23 Encounter for immunization: Secondary | ICD-10-CM | POA: Diagnosis not present

## 2018-12-01 DIAGNOSIS — N186 End stage renal disease: Secondary | ICD-10-CM | POA: Diagnosis not present

## 2018-12-04 DIAGNOSIS — N186 End stage renal disease: Secondary | ICD-10-CM | POA: Diagnosis not present

## 2018-12-04 DIAGNOSIS — Z23 Encounter for immunization: Secondary | ICD-10-CM | POA: Diagnosis not present

## 2018-12-04 DIAGNOSIS — E875 Hyperkalemia: Secondary | ICD-10-CM | POA: Diagnosis not present

## 2018-12-04 DIAGNOSIS — N2581 Secondary hyperparathyroidism of renal origin: Secondary | ICD-10-CM | POA: Diagnosis not present

## 2018-12-06 DIAGNOSIS — N186 End stage renal disease: Secondary | ICD-10-CM | POA: Diagnosis not present

## 2018-12-06 DIAGNOSIS — Z23 Encounter for immunization: Secondary | ICD-10-CM | POA: Diagnosis not present

## 2018-12-06 DIAGNOSIS — N2581 Secondary hyperparathyroidism of renal origin: Secondary | ICD-10-CM | POA: Diagnosis not present

## 2018-12-06 DIAGNOSIS — E875 Hyperkalemia: Secondary | ICD-10-CM | POA: Diagnosis not present

## 2018-12-08 DIAGNOSIS — N186 End stage renal disease: Secondary | ICD-10-CM | POA: Diagnosis not present

## 2018-12-08 DIAGNOSIS — N2581 Secondary hyperparathyroidism of renal origin: Secondary | ICD-10-CM | POA: Diagnosis not present

## 2018-12-08 DIAGNOSIS — Z23 Encounter for immunization: Secondary | ICD-10-CM | POA: Diagnosis not present

## 2018-12-08 DIAGNOSIS — E875 Hyperkalemia: Secondary | ICD-10-CM | POA: Diagnosis not present

## 2018-12-11 DIAGNOSIS — E875 Hyperkalemia: Secondary | ICD-10-CM | POA: Diagnosis not present

## 2018-12-11 DIAGNOSIS — N186 End stage renal disease: Secondary | ICD-10-CM | POA: Diagnosis not present

## 2018-12-11 DIAGNOSIS — Z23 Encounter for immunization: Secondary | ICD-10-CM | POA: Diagnosis not present

## 2018-12-11 DIAGNOSIS — N2581 Secondary hyperparathyroidism of renal origin: Secondary | ICD-10-CM | POA: Diagnosis not present

## 2018-12-13 DIAGNOSIS — E875 Hyperkalemia: Secondary | ICD-10-CM | POA: Diagnosis not present

## 2018-12-13 DIAGNOSIS — N2581 Secondary hyperparathyroidism of renal origin: Secondary | ICD-10-CM | POA: Diagnosis not present

## 2018-12-13 DIAGNOSIS — N186 End stage renal disease: Secondary | ICD-10-CM | POA: Diagnosis not present

## 2018-12-13 DIAGNOSIS — Z23 Encounter for immunization: Secondary | ICD-10-CM | POA: Diagnosis not present

## 2018-12-15 DIAGNOSIS — Z23 Encounter for immunization: Secondary | ICD-10-CM | POA: Diagnosis not present

## 2018-12-15 DIAGNOSIS — E875 Hyperkalemia: Secondary | ICD-10-CM | POA: Diagnosis not present

## 2018-12-15 DIAGNOSIS — N186 End stage renal disease: Secondary | ICD-10-CM | POA: Diagnosis not present

## 2018-12-15 DIAGNOSIS — N2581 Secondary hyperparathyroidism of renal origin: Secondary | ICD-10-CM | POA: Diagnosis not present

## 2018-12-17 DIAGNOSIS — N049 Nephrotic syndrome with unspecified morphologic changes: Secondary | ICD-10-CM | POA: Diagnosis not present

## 2018-12-17 DIAGNOSIS — N186 End stage renal disease: Secondary | ICD-10-CM | POA: Diagnosis not present

## 2018-12-17 DIAGNOSIS — Z992 Dependence on renal dialysis: Secondary | ICD-10-CM | POA: Diagnosis not present

## 2018-12-18 DIAGNOSIS — N2581 Secondary hyperparathyroidism of renal origin: Secondary | ICD-10-CM | POA: Diagnosis not present

## 2018-12-18 DIAGNOSIS — Z23 Encounter for immunization: Secondary | ICD-10-CM | POA: Diagnosis not present

## 2018-12-18 DIAGNOSIS — E875 Hyperkalemia: Secondary | ICD-10-CM | POA: Diagnosis not present

## 2018-12-18 DIAGNOSIS — N186 End stage renal disease: Secondary | ICD-10-CM | POA: Diagnosis not present

## 2018-12-18 DIAGNOSIS — D631 Anemia in chronic kidney disease: Secondary | ICD-10-CM | POA: Diagnosis not present

## 2018-12-20 DIAGNOSIS — Z23 Encounter for immunization: Secondary | ICD-10-CM | POA: Diagnosis not present

## 2018-12-20 DIAGNOSIS — E875 Hyperkalemia: Secondary | ICD-10-CM | POA: Diagnosis not present

## 2018-12-20 DIAGNOSIS — N186 End stage renal disease: Secondary | ICD-10-CM | POA: Diagnosis not present

## 2018-12-20 DIAGNOSIS — D631 Anemia in chronic kidney disease: Secondary | ICD-10-CM | POA: Diagnosis not present

## 2018-12-20 DIAGNOSIS — N2581 Secondary hyperparathyroidism of renal origin: Secondary | ICD-10-CM | POA: Diagnosis not present

## 2018-12-22 DIAGNOSIS — Z23 Encounter for immunization: Secondary | ICD-10-CM | POA: Diagnosis not present

## 2018-12-22 DIAGNOSIS — E875 Hyperkalemia: Secondary | ICD-10-CM | POA: Diagnosis not present

## 2018-12-22 DIAGNOSIS — D631 Anemia in chronic kidney disease: Secondary | ICD-10-CM | POA: Diagnosis not present

## 2018-12-22 DIAGNOSIS — N186 End stage renal disease: Secondary | ICD-10-CM | POA: Diagnosis not present

## 2018-12-22 DIAGNOSIS — N2581 Secondary hyperparathyroidism of renal origin: Secondary | ICD-10-CM | POA: Diagnosis not present

## 2018-12-25 DIAGNOSIS — E875 Hyperkalemia: Secondary | ICD-10-CM | POA: Diagnosis not present

## 2018-12-25 DIAGNOSIS — D631 Anemia in chronic kidney disease: Secondary | ICD-10-CM | POA: Diagnosis not present

## 2018-12-25 DIAGNOSIS — N2581 Secondary hyperparathyroidism of renal origin: Secondary | ICD-10-CM | POA: Diagnosis not present

## 2018-12-25 DIAGNOSIS — N186 End stage renal disease: Secondary | ICD-10-CM | POA: Diagnosis not present

## 2018-12-25 DIAGNOSIS — Z23 Encounter for immunization: Secondary | ICD-10-CM | POA: Diagnosis not present

## 2018-12-26 DIAGNOSIS — N186 End stage renal disease: Secondary | ICD-10-CM | POA: Diagnosis not present

## 2018-12-26 DIAGNOSIS — Z992 Dependence on renal dialysis: Secondary | ICD-10-CM | POA: Diagnosis not present

## 2018-12-26 DIAGNOSIS — I871 Compression of vein: Secondary | ICD-10-CM | POA: Diagnosis not present

## 2018-12-26 DIAGNOSIS — T82858A Stenosis of vascular prosthetic devices, implants and grafts, initial encounter: Secondary | ICD-10-CM | POA: Diagnosis not present

## 2018-12-27 DIAGNOSIS — Z23 Encounter for immunization: Secondary | ICD-10-CM | POA: Diagnosis not present

## 2018-12-27 DIAGNOSIS — N2581 Secondary hyperparathyroidism of renal origin: Secondary | ICD-10-CM | POA: Diagnosis not present

## 2018-12-27 DIAGNOSIS — N186 End stage renal disease: Secondary | ICD-10-CM | POA: Diagnosis not present

## 2018-12-27 DIAGNOSIS — E875 Hyperkalemia: Secondary | ICD-10-CM | POA: Diagnosis not present

## 2018-12-27 DIAGNOSIS — D631 Anemia in chronic kidney disease: Secondary | ICD-10-CM | POA: Diagnosis not present

## 2018-12-29 DIAGNOSIS — D631 Anemia in chronic kidney disease: Secondary | ICD-10-CM | POA: Diagnosis not present

## 2018-12-29 DIAGNOSIS — N186 End stage renal disease: Secondary | ICD-10-CM | POA: Diagnosis not present

## 2018-12-29 DIAGNOSIS — N2581 Secondary hyperparathyroidism of renal origin: Secondary | ICD-10-CM | POA: Diagnosis not present

## 2018-12-29 DIAGNOSIS — E875 Hyperkalemia: Secondary | ICD-10-CM | POA: Diagnosis not present

## 2018-12-29 DIAGNOSIS — Z23 Encounter for immunization: Secondary | ICD-10-CM | POA: Diagnosis not present

## 2019-01-01 DIAGNOSIS — Z23 Encounter for immunization: Secondary | ICD-10-CM | POA: Diagnosis not present

## 2019-01-01 DIAGNOSIS — N2581 Secondary hyperparathyroidism of renal origin: Secondary | ICD-10-CM | POA: Diagnosis not present

## 2019-01-01 DIAGNOSIS — E875 Hyperkalemia: Secondary | ICD-10-CM | POA: Diagnosis not present

## 2019-01-01 DIAGNOSIS — N186 End stage renal disease: Secondary | ICD-10-CM | POA: Diagnosis not present

## 2019-01-01 DIAGNOSIS — D631 Anemia in chronic kidney disease: Secondary | ICD-10-CM | POA: Diagnosis not present

## 2019-01-03 DIAGNOSIS — Z23 Encounter for immunization: Secondary | ICD-10-CM | POA: Diagnosis not present

## 2019-01-03 DIAGNOSIS — N2581 Secondary hyperparathyroidism of renal origin: Secondary | ICD-10-CM | POA: Diagnosis not present

## 2019-01-03 DIAGNOSIS — E875 Hyperkalemia: Secondary | ICD-10-CM | POA: Diagnosis not present

## 2019-01-03 DIAGNOSIS — N186 End stage renal disease: Secondary | ICD-10-CM | POA: Diagnosis not present

## 2019-01-03 DIAGNOSIS — D631 Anemia in chronic kidney disease: Secondary | ICD-10-CM | POA: Diagnosis not present

## 2019-01-05 DIAGNOSIS — E875 Hyperkalemia: Secondary | ICD-10-CM | POA: Diagnosis not present

## 2019-01-05 DIAGNOSIS — D631 Anemia in chronic kidney disease: Secondary | ICD-10-CM | POA: Diagnosis not present

## 2019-01-05 DIAGNOSIS — Z23 Encounter for immunization: Secondary | ICD-10-CM | POA: Diagnosis not present

## 2019-01-05 DIAGNOSIS — N2581 Secondary hyperparathyroidism of renal origin: Secondary | ICD-10-CM | POA: Diagnosis not present

## 2019-01-05 DIAGNOSIS — N186 End stage renal disease: Secondary | ICD-10-CM | POA: Diagnosis not present

## 2019-01-08 DIAGNOSIS — Z23 Encounter for immunization: Secondary | ICD-10-CM | POA: Diagnosis not present

## 2019-01-08 DIAGNOSIS — N2581 Secondary hyperparathyroidism of renal origin: Secondary | ICD-10-CM | POA: Diagnosis not present

## 2019-01-08 DIAGNOSIS — E875 Hyperkalemia: Secondary | ICD-10-CM | POA: Diagnosis not present

## 2019-01-08 DIAGNOSIS — D631 Anemia in chronic kidney disease: Secondary | ICD-10-CM | POA: Diagnosis not present

## 2019-01-08 DIAGNOSIS — N186 End stage renal disease: Secondary | ICD-10-CM | POA: Diagnosis not present

## 2019-01-10 DIAGNOSIS — D631 Anemia in chronic kidney disease: Secondary | ICD-10-CM | POA: Diagnosis not present

## 2019-01-10 DIAGNOSIS — N2581 Secondary hyperparathyroidism of renal origin: Secondary | ICD-10-CM | POA: Diagnosis not present

## 2019-01-10 DIAGNOSIS — Z23 Encounter for immunization: Secondary | ICD-10-CM | POA: Diagnosis not present

## 2019-01-10 DIAGNOSIS — N186 End stage renal disease: Secondary | ICD-10-CM | POA: Diagnosis not present

## 2019-01-10 DIAGNOSIS — E875 Hyperkalemia: Secondary | ICD-10-CM | POA: Diagnosis not present

## 2019-01-12 DIAGNOSIS — D631 Anemia in chronic kidney disease: Secondary | ICD-10-CM | POA: Diagnosis not present

## 2019-01-12 DIAGNOSIS — E875 Hyperkalemia: Secondary | ICD-10-CM | POA: Diagnosis not present

## 2019-01-12 DIAGNOSIS — N2581 Secondary hyperparathyroidism of renal origin: Secondary | ICD-10-CM | POA: Diagnosis not present

## 2019-01-12 DIAGNOSIS — Z23 Encounter for immunization: Secondary | ICD-10-CM | POA: Diagnosis not present

## 2019-01-12 DIAGNOSIS — N186 End stage renal disease: Secondary | ICD-10-CM | POA: Diagnosis not present

## 2019-01-15 DIAGNOSIS — E875 Hyperkalemia: Secondary | ICD-10-CM | POA: Diagnosis not present

## 2019-01-15 DIAGNOSIS — Z23 Encounter for immunization: Secondary | ICD-10-CM | POA: Diagnosis not present

## 2019-01-15 DIAGNOSIS — N2581 Secondary hyperparathyroidism of renal origin: Secondary | ICD-10-CM | POA: Diagnosis not present

## 2019-01-15 DIAGNOSIS — D631 Anemia in chronic kidney disease: Secondary | ICD-10-CM | POA: Diagnosis not present

## 2019-01-15 DIAGNOSIS — N186 End stage renal disease: Secondary | ICD-10-CM | POA: Diagnosis not present

## 2019-01-16 DIAGNOSIS — N049 Nephrotic syndrome with unspecified morphologic changes: Secondary | ICD-10-CM | POA: Diagnosis not present

## 2019-01-16 DIAGNOSIS — Z992 Dependence on renal dialysis: Secondary | ICD-10-CM | POA: Diagnosis not present

## 2019-01-16 DIAGNOSIS — N186 End stage renal disease: Secondary | ICD-10-CM | POA: Diagnosis not present

## 2019-01-17 DIAGNOSIS — E875 Hyperkalemia: Secondary | ICD-10-CM | POA: Diagnosis not present

## 2019-01-17 DIAGNOSIS — N2581 Secondary hyperparathyroidism of renal origin: Secondary | ICD-10-CM | POA: Diagnosis not present

## 2019-01-17 DIAGNOSIS — D631 Anemia in chronic kidney disease: Secondary | ICD-10-CM | POA: Diagnosis not present

## 2019-01-17 DIAGNOSIS — N186 End stage renal disease: Secondary | ICD-10-CM | POA: Diagnosis not present

## 2019-01-17 DIAGNOSIS — Z23 Encounter for immunization: Secondary | ICD-10-CM | POA: Diagnosis not present

## 2019-01-19 DIAGNOSIS — N186 End stage renal disease: Secondary | ICD-10-CM | POA: Diagnosis not present

## 2019-01-19 DIAGNOSIS — E875 Hyperkalemia: Secondary | ICD-10-CM | POA: Diagnosis not present

## 2019-01-19 DIAGNOSIS — D631 Anemia in chronic kidney disease: Secondary | ICD-10-CM | POA: Diagnosis not present

## 2019-01-19 DIAGNOSIS — N2581 Secondary hyperparathyroidism of renal origin: Secondary | ICD-10-CM | POA: Diagnosis not present

## 2019-01-19 DIAGNOSIS — Z23 Encounter for immunization: Secondary | ICD-10-CM | POA: Diagnosis not present

## 2019-01-22 DIAGNOSIS — N2581 Secondary hyperparathyroidism of renal origin: Secondary | ICD-10-CM | POA: Diagnosis not present

## 2019-01-22 DIAGNOSIS — E875 Hyperkalemia: Secondary | ICD-10-CM | POA: Diagnosis not present

## 2019-01-22 DIAGNOSIS — N186 End stage renal disease: Secondary | ICD-10-CM | POA: Diagnosis not present

## 2019-01-22 DIAGNOSIS — Z23 Encounter for immunization: Secondary | ICD-10-CM | POA: Diagnosis not present

## 2019-01-22 DIAGNOSIS — D631 Anemia in chronic kidney disease: Secondary | ICD-10-CM | POA: Diagnosis not present

## 2019-01-24 DIAGNOSIS — D631 Anemia in chronic kidney disease: Secondary | ICD-10-CM | POA: Diagnosis not present

## 2019-01-24 DIAGNOSIS — N2581 Secondary hyperparathyroidism of renal origin: Secondary | ICD-10-CM | POA: Diagnosis not present

## 2019-01-24 DIAGNOSIS — E875 Hyperkalemia: Secondary | ICD-10-CM | POA: Diagnosis not present

## 2019-01-24 DIAGNOSIS — N186 End stage renal disease: Secondary | ICD-10-CM | POA: Diagnosis not present

## 2019-01-24 DIAGNOSIS — Z23 Encounter for immunization: Secondary | ICD-10-CM | POA: Diagnosis not present

## 2019-01-26 DIAGNOSIS — E875 Hyperkalemia: Secondary | ICD-10-CM | POA: Diagnosis not present

## 2019-01-26 DIAGNOSIS — D631 Anemia in chronic kidney disease: Secondary | ICD-10-CM | POA: Diagnosis not present

## 2019-01-26 DIAGNOSIS — Z23 Encounter for immunization: Secondary | ICD-10-CM | POA: Diagnosis not present

## 2019-01-26 DIAGNOSIS — N186 End stage renal disease: Secondary | ICD-10-CM | POA: Diagnosis not present

## 2019-01-26 DIAGNOSIS — N2581 Secondary hyperparathyroidism of renal origin: Secondary | ICD-10-CM | POA: Diagnosis not present

## 2019-01-29 DIAGNOSIS — N2581 Secondary hyperparathyroidism of renal origin: Secondary | ICD-10-CM | POA: Diagnosis not present

## 2019-01-29 DIAGNOSIS — E875 Hyperkalemia: Secondary | ICD-10-CM | POA: Diagnosis not present

## 2019-01-29 DIAGNOSIS — Z23 Encounter for immunization: Secondary | ICD-10-CM | POA: Diagnosis not present

## 2019-01-29 DIAGNOSIS — D631 Anemia in chronic kidney disease: Secondary | ICD-10-CM | POA: Diagnosis not present

## 2019-01-29 DIAGNOSIS — N186 End stage renal disease: Secondary | ICD-10-CM | POA: Diagnosis not present

## 2019-01-31 DIAGNOSIS — N2581 Secondary hyperparathyroidism of renal origin: Secondary | ICD-10-CM | POA: Diagnosis not present

## 2019-01-31 DIAGNOSIS — D631 Anemia in chronic kidney disease: Secondary | ICD-10-CM | POA: Diagnosis not present

## 2019-01-31 DIAGNOSIS — E875 Hyperkalemia: Secondary | ICD-10-CM | POA: Diagnosis not present

## 2019-01-31 DIAGNOSIS — Z23 Encounter for immunization: Secondary | ICD-10-CM | POA: Diagnosis not present

## 2019-01-31 DIAGNOSIS — N186 End stage renal disease: Secondary | ICD-10-CM | POA: Diagnosis not present

## 2019-02-02 DIAGNOSIS — E875 Hyperkalemia: Secondary | ICD-10-CM | POA: Diagnosis not present

## 2019-02-02 DIAGNOSIS — D631 Anemia in chronic kidney disease: Secondary | ICD-10-CM | POA: Diagnosis not present

## 2019-02-02 DIAGNOSIS — N2581 Secondary hyperparathyroidism of renal origin: Secondary | ICD-10-CM | POA: Diagnosis not present

## 2019-02-02 DIAGNOSIS — Z23 Encounter for immunization: Secondary | ICD-10-CM | POA: Diagnosis not present

## 2019-02-02 DIAGNOSIS — N186 End stage renal disease: Secondary | ICD-10-CM | POA: Diagnosis not present

## 2019-02-05 DIAGNOSIS — E875 Hyperkalemia: Secondary | ICD-10-CM | POA: Diagnosis not present

## 2019-02-05 DIAGNOSIS — D631 Anemia in chronic kidney disease: Secondary | ICD-10-CM | POA: Diagnosis not present

## 2019-02-05 DIAGNOSIS — N2581 Secondary hyperparathyroidism of renal origin: Secondary | ICD-10-CM | POA: Diagnosis not present

## 2019-02-05 DIAGNOSIS — N186 End stage renal disease: Secondary | ICD-10-CM | POA: Diagnosis not present

## 2019-02-05 DIAGNOSIS — Z23 Encounter for immunization: Secondary | ICD-10-CM | POA: Diagnosis not present

## 2019-02-07 DIAGNOSIS — E875 Hyperkalemia: Secondary | ICD-10-CM | POA: Diagnosis not present

## 2019-02-07 DIAGNOSIS — N2581 Secondary hyperparathyroidism of renal origin: Secondary | ICD-10-CM | POA: Diagnosis not present

## 2019-02-07 DIAGNOSIS — D631 Anemia in chronic kidney disease: Secondary | ICD-10-CM | POA: Diagnosis not present

## 2019-02-07 DIAGNOSIS — Z23 Encounter for immunization: Secondary | ICD-10-CM | POA: Diagnosis not present

## 2019-02-07 DIAGNOSIS — N186 End stage renal disease: Secondary | ICD-10-CM | POA: Diagnosis not present

## 2019-02-09 DIAGNOSIS — N2581 Secondary hyperparathyroidism of renal origin: Secondary | ICD-10-CM | POA: Diagnosis not present

## 2019-02-09 DIAGNOSIS — Z23 Encounter for immunization: Secondary | ICD-10-CM | POA: Diagnosis not present

## 2019-02-09 DIAGNOSIS — D631 Anemia in chronic kidney disease: Secondary | ICD-10-CM | POA: Diagnosis not present

## 2019-02-09 DIAGNOSIS — E875 Hyperkalemia: Secondary | ICD-10-CM | POA: Diagnosis not present

## 2019-02-09 DIAGNOSIS — N186 End stage renal disease: Secondary | ICD-10-CM | POA: Diagnosis not present

## 2019-02-12 DIAGNOSIS — Z23 Encounter for immunization: Secondary | ICD-10-CM | POA: Diagnosis not present

## 2019-02-12 DIAGNOSIS — N186 End stage renal disease: Secondary | ICD-10-CM | POA: Diagnosis not present

## 2019-02-12 DIAGNOSIS — D631 Anemia in chronic kidney disease: Secondary | ICD-10-CM | POA: Diagnosis not present

## 2019-02-12 DIAGNOSIS — E875 Hyperkalemia: Secondary | ICD-10-CM | POA: Diagnosis not present

## 2019-02-12 DIAGNOSIS — N2581 Secondary hyperparathyroidism of renal origin: Secondary | ICD-10-CM | POA: Diagnosis not present

## 2019-02-14 DIAGNOSIS — E875 Hyperkalemia: Secondary | ICD-10-CM | POA: Diagnosis not present

## 2019-02-14 DIAGNOSIS — Z23 Encounter for immunization: Secondary | ICD-10-CM | POA: Diagnosis not present

## 2019-02-14 DIAGNOSIS — N186 End stage renal disease: Secondary | ICD-10-CM | POA: Diagnosis not present

## 2019-02-14 DIAGNOSIS — D631 Anemia in chronic kidney disease: Secondary | ICD-10-CM | POA: Diagnosis not present

## 2019-02-14 DIAGNOSIS — N2581 Secondary hyperparathyroidism of renal origin: Secondary | ICD-10-CM | POA: Diagnosis not present

## 2019-02-16 DIAGNOSIS — E875 Hyperkalemia: Secondary | ICD-10-CM | POA: Diagnosis not present

## 2019-02-16 DIAGNOSIS — Z992 Dependence on renal dialysis: Secondary | ICD-10-CM | POA: Diagnosis not present

## 2019-02-16 DIAGNOSIS — N049 Nephrotic syndrome with unspecified morphologic changes: Secondary | ICD-10-CM | POA: Diagnosis not present

## 2019-02-16 DIAGNOSIS — N2581 Secondary hyperparathyroidism of renal origin: Secondary | ICD-10-CM | POA: Diagnosis not present

## 2019-02-16 DIAGNOSIS — N186 End stage renal disease: Secondary | ICD-10-CM | POA: Diagnosis not present

## 2019-02-19 DIAGNOSIS — N2581 Secondary hyperparathyroidism of renal origin: Secondary | ICD-10-CM | POA: Diagnosis not present

## 2019-02-19 DIAGNOSIS — Z992 Dependence on renal dialysis: Secondary | ICD-10-CM | POA: Diagnosis not present

## 2019-02-19 DIAGNOSIS — N186 End stage renal disease: Secondary | ICD-10-CM | POA: Diagnosis not present

## 2019-02-19 DIAGNOSIS — E875 Hyperkalemia: Secondary | ICD-10-CM | POA: Diagnosis not present

## 2019-02-21 DIAGNOSIS — Z992 Dependence on renal dialysis: Secondary | ICD-10-CM | POA: Diagnosis not present

## 2019-02-21 DIAGNOSIS — N186 End stage renal disease: Secondary | ICD-10-CM | POA: Diagnosis not present

## 2019-02-21 DIAGNOSIS — E875 Hyperkalemia: Secondary | ICD-10-CM | POA: Diagnosis not present

## 2019-02-21 DIAGNOSIS — N2581 Secondary hyperparathyroidism of renal origin: Secondary | ICD-10-CM | POA: Diagnosis not present

## 2019-02-23 DIAGNOSIS — Z992 Dependence on renal dialysis: Secondary | ICD-10-CM | POA: Diagnosis not present

## 2019-02-23 DIAGNOSIS — N186 End stage renal disease: Secondary | ICD-10-CM | POA: Diagnosis not present

## 2019-02-23 DIAGNOSIS — N2581 Secondary hyperparathyroidism of renal origin: Secondary | ICD-10-CM | POA: Diagnosis not present

## 2019-02-23 DIAGNOSIS — E875 Hyperkalemia: Secondary | ICD-10-CM | POA: Diagnosis not present

## 2019-02-25 DIAGNOSIS — L821 Other seborrheic keratosis: Secondary | ICD-10-CM | POA: Diagnosis not present

## 2019-02-25 DIAGNOSIS — X32XXXD Exposure to sunlight, subsequent encounter: Secondary | ICD-10-CM | POA: Diagnosis not present

## 2019-02-25 DIAGNOSIS — L570 Actinic keratosis: Secondary | ICD-10-CM | POA: Diagnosis not present

## 2019-02-25 DIAGNOSIS — D225 Melanocytic nevi of trunk: Secondary | ICD-10-CM | POA: Diagnosis not present

## 2019-02-25 DIAGNOSIS — L820 Inflamed seborrheic keratosis: Secondary | ICD-10-CM | POA: Diagnosis not present

## 2019-02-26 DIAGNOSIS — N186 End stage renal disease: Secondary | ICD-10-CM | POA: Diagnosis not present

## 2019-02-26 DIAGNOSIS — N2581 Secondary hyperparathyroidism of renal origin: Secondary | ICD-10-CM | POA: Diagnosis not present

## 2019-02-26 DIAGNOSIS — Z992 Dependence on renal dialysis: Secondary | ICD-10-CM | POA: Diagnosis not present

## 2019-02-26 DIAGNOSIS — E875 Hyperkalemia: Secondary | ICD-10-CM | POA: Diagnosis not present

## 2019-02-28 DIAGNOSIS — E875 Hyperkalemia: Secondary | ICD-10-CM | POA: Diagnosis not present

## 2019-02-28 DIAGNOSIS — Z992 Dependence on renal dialysis: Secondary | ICD-10-CM | POA: Diagnosis not present

## 2019-02-28 DIAGNOSIS — N186 End stage renal disease: Secondary | ICD-10-CM | POA: Diagnosis not present

## 2019-02-28 DIAGNOSIS — N2581 Secondary hyperparathyroidism of renal origin: Secondary | ICD-10-CM | POA: Diagnosis not present

## 2019-03-02 DIAGNOSIS — E875 Hyperkalemia: Secondary | ICD-10-CM | POA: Diagnosis not present

## 2019-03-02 DIAGNOSIS — N2581 Secondary hyperparathyroidism of renal origin: Secondary | ICD-10-CM | POA: Diagnosis not present

## 2019-03-02 DIAGNOSIS — Z992 Dependence on renal dialysis: Secondary | ICD-10-CM | POA: Diagnosis not present

## 2019-03-02 DIAGNOSIS — N186 End stage renal disease: Secondary | ICD-10-CM | POA: Diagnosis not present

## 2019-03-05 DIAGNOSIS — N2581 Secondary hyperparathyroidism of renal origin: Secondary | ICD-10-CM | POA: Diagnosis not present

## 2019-03-05 DIAGNOSIS — Z992 Dependence on renal dialysis: Secondary | ICD-10-CM | POA: Diagnosis not present

## 2019-03-05 DIAGNOSIS — E875 Hyperkalemia: Secondary | ICD-10-CM | POA: Diagnosis not present

## 2019-03-05 DIAGNOSIS — N186 End stage renal disease: Secondary | ICD-10-CM | POA: Diagnosis not present

## 2019-03-07 DIAGNOSIS — E875 Hyperkalemia: Secondary | ICD-10-CM | POA: Diagnosis not present

## 2019-03-07 DIAGNOSIS — N186 End stage renal disease: Secondary | ICD-10-CM | POA: Diagnosis not present

## 2019-03-07 DIAGNOSIS — N2581 Secondary hyperparathyroidism of renal origin: Secondary | ICD-10-CM | POA: Diagnosis not present

## 2019-03-07 DIAGNOSIS — Z992 Dependence on renal dialysis: Secondary | ICD-10-CM | POA: Diagnosis not present

## 2019-03-09 DIAGNOSIS — N186 End stage renal disease: Secondary | ICD-10-CM | POA: Diagnosis not present

## 2019-03-09 DIAGNOSIS — E875 Hyperkalemia: Secondary | ICD-10-CM | POA: Diagnosis not present

## 2019-03-09 DIAGNOSIS — Z992 Dependence on renal dialysis: Secondary | ICD-10-CM | POA: Diagnosis not present

## 2019-03-09 DIAGNOSIS — N2581 Secondary hyperparathyroidism of renal origin: Secondary | ICD-10-CM | POA: Diagnosis not present

## 2019-03-12 DIAGNOSIS — E875 Hyperkalemia: Secondary | ICD-10-CM | POA: Diagnosis not present

## 2019-03-12 DIAGNOSIS — Z992 Dependence on renal dialysis: Secondary | ICD-10-CM | POA: Diagnosis not present

## 2019-03-12 DIAGNOSIS — N186 End stage renal disease: Secondary | ICD-10-CM | POA: Diagnosis not present

## 2019-03-12 DIAGNOSIS — N2581 Secondary hyperparathyroidism of renal origin: Secondary | ICD-10-CM | POA: Diagnosis not present

## 2019-03-14 DIAGNOSIS — N186 End stage renal disease: Secondary | ICD-10-CM | POA: Diagnosis not present

## 2019-03-14 DIAGNOSIS — N2581 Secondary hyperparathyroidism of renal origin: Secondary | ICD-10-CM | POA: Diagnosis not present

## 2019-03-14 DIAGNOSIS — E875 Hyperkalemia: Secondary | ICD-10-CM | POA: Diagnosis not present

## 2019-03-14 DIAGNOSIS — Z992 Dependence on renal dialysis: Secondary | ICD-10-CM | POA: Diagnosis not present

## 2019-03-16 DIAGNOSIS — N2581 Secondary hyperparathyroidism of renal origin: Secondary | ICD-10-CM | POA: Diagnosis not present

## 2019-03-16 DIAGNOSIS — Z992 Dependence on renal dialysis: Secondary | ICD-10-CM | POA: Diagnosis not present

## 2019-03-16 DIAGNOSIS — N186 End stage renal disease: Secondary | ICD-10-CM | POA: Diagnosis not present

## 2019-03-16 DIAGNOSIS — E875 Hyperkalemia: Secondary | ICD-10-CM | POA: Diagnosis not present

## 2019-03-19 DIAGNOSIS — N2581 Secondary hyperparathyroidism of renal origin: Secondary | ICD-10-CM | POA: Diagnosis not present

## 2019-03-19 DIAGNOSIS — N049 Nephrotic syndrome with unspecified morphologic changes: Secondary | ICD-10-CM | POA: Diagnosis not present

## 2019-03-19 DIAGNOSIS — N186 End stage renal disease: Secondary | ICD-10-CM | POA: Diagnosis not present

## 2019-03-19 DIAGNOSIS — E875 Hyperkalemia: Secondary | ICD-10-CM | POA: Diagnosis not present

## 2019-03-19 DIAGNOSIS — Z992 Dependence on renal dialysis: Secondary | ICD-10-CM | POA: Diagnosis not present

## 2019-03-21 DIAGNOSIS — Z992 Dependence on renal dialysis: Secondary | ICD-10-CM | POA: Diagnosis not present

## 2019-03-21 DIAGNOSIS — E875 Hyperkalemia: Secondary | ICD-10-CM | POA: Diagnosis not present

## 2019-03-21 DIAGNOSIS — N2581 Secondary hyperparathyroidism of renal origin: Secondary | ICD-10-CM | POA: Diagnosis not present

## 2019-03-21 DIAGNOSIS — N186 End stage renal disease: Secondary | ICD-10-CM | POA: Diagnosis not present

## 2019-03-23 DIAGNOSIS — N186 End stage renal disease: Secondary | ICD-10-CM | POA: Diagnosis not present

## 2019-03-23 DIAGNOSIS — N2581 Secondary hyperparathyroidism of renal origin: Secondary | ICD-10-CM | POA: Diagnosis not present

## 2019-03-23 DIAGNOSIS — Z992 Dependence on renal dialysis: Secondary | ICD-10-CM | POA: Diagnosis not present

## 2019-03-23 DIAGNOSIS — E875 Hyperkalemia: Secondary | ICD-10-CM | POA: Diagnosis not present

## 2019-03-26 DIAGNOSIS — E875 Hyperkalemia: Secondary | ICD-10-CM | POA: Diagnosis not present

## 2019-03-26 DIAGNOSIS — N186 End stage renal disease: Secondary | ICD-10-CM | POA: Diagnosis not present

## 2019-03-26 DIAGNOSIS — Z992 Dependence on renal dialysis: Secondary | ICD-10-CM | POA: Diagnosis not present

## 2019-03-26 DIAGNOSIS — N2581 Secondary hyperparathyroidism of renal origin: Secondary | ICD-10-CM | POA: Diagnosis not present

## 2019-03-28 DIAGNOSIS — Z992 Dependence on renal dialysis: Secondary | ICD-10-CM | POA: Diagnosis not present

## 2019-03-28 DIAGNOSIS — E875 Hyperkalemia: Secondary | ICD-10-CM | POA: Diagnosis not present

## 2019-03-28 DIAGNOSIS — N2581 Secondary hyperparathyroidism of renal origin: Secondary | ICD-10-CM | POA: Diagnosis not present

## 2019-03-28 DIAGNOSIS — N186 End stage renal disease: Secondary | ICD-10-CM | POA: Diagnosis not present

## 2019-03-30 DIAGNOSIS — N2581 Secondary hyperparathyroidism of renal origin: Secondary | ICD-10-CM | POA: Diagnosis not present

## 2019-03-30 DIAGNOSIS — E875 Hyperkalemia: Secondary | ICD-10-CM | POA: Diagnosis not present

## 2019-03-30 DIAGNOSIS — N186 End stage renal disease: Secondary | ICD-10-CM | POA: Diagnosis not present

## 2019-03-30 DIAGNOSIS — Z992 Dependence on renal dialysis: Secondary | ICD-10-CM | POA: Diagnosis not present

## 2019-04-02 DIAGNOSIS — N2581 Secondary hyperparathyroidism of renal origin: Secondary | ICD-10-CM | POA: Diagnosis not present

## 2019-04-02 DIAGNOSIS — Z992 Dependence on renal dialysis: Secondary | ICD-10-CM | POA: Diagnosis not present

## 2019-04-02 DIAGNOSIS — N186 End stage renal disease: Secondary | ICD-10-CM | POA: Diagnosis not present

## 2019-04-02 DIAGNOSIS — E875 Hyperkalemia: Secondary | ICD-10-CM | POA: Diagnosis not present

## 2019-04-04 DIAGNOSIS — Z992 Dependence on renal dialysis: Secondary | ICD-10-CM | POA: Diagnosis not present

## 2019-04-04 DIAGNOSIS — N2581 Secondary hyperparathyroidism of renal origin: Secondary | ICD-10-CM | POA: Diagnosis not present

## 2019-04-04 DIAGNOSIS — E875 Hyperkalemia: Secondary | ICD-10-CM | POA: Diagnosis not present

## 2019-04-04 DIAGNOSIS — N186 End stage renal disease: Secondary | ICD-10-CM | POA: Diagnosis not present

## 2019-04-06 DIAGNOSIS — Z992 Dependence on renal dialysis: Secondary | ICD-10-CM | POA: Diagnosis not present

## 2019-04-06 DIAGNOSIS — E875 Hyperkalemia: Secondary | ICD-10-CM | POA: Diagnosis not present

## 2019-04-06 DIAGNOSIS — N186 End stage renal disease: Secondary | ICD-10-CM | POA: Diagnosis not present

## 2019-04-06 DIAGNOSIS — N2581 Secondary hyperparathyroidism of renal origin: Secondary | ICD-10-CM | POA: Diagnosis not present

## 2019-04-09 DIAGNOSIS — N186 End stage renal disease: Secondary | ICD-10-CM | POA: Diagnosis not present

## 2019-04-09 DIAGNOSIS — Z992 Dependence on renal dialysis: Secondary | ICD-10-CM | POA: Diagnosis not present

## 2019-04-09 DIAGNOSIS — N2581 Secondary hyperparathyroidism of renal origin: Secondary | ICD-10-CM | POA: Diagnosis not present

## 2019-04-09 DIAGNOSIS — E875 Hyperkalemia: Secondary | ICD-10-CM | POA: Diagnosis not present

## 2019-04-11 DIAGNOSIS — N2581 Secondary hyperparathyroidism of renal origin: Secondary | ICD-10-CM | POA: Diagnosis not present

## 2019-04-11 DIAGNOSIS — E875 Hyperkalemia: Secondary | ICD-10-CM | POA: Diagnosis not present

## 2019-04-11 DIAGNOSIS — Z992 Dependence on renal dialysis: Secondary | ICD-10-CM | POA: Diagnosis not present

## 2019-04-11 DIAGNOSIS — N186 End stage renal disease: Secondary | ICD-10-CM | POA: Diagnosis not present

## 2019-04-13 DIAGNOSIS — N2581 Secondary hyperparathyroidism of renal origin: Secondary | ICD-10-CM | POA: Diagnosis not present

## 2019-04-13 DIAGNOSIS — E875 Hyperkalemia: Secondary | ICD-10-CM | POA: Diagnosis not present

## 2019-04-13 DIAGNOSIS — Z992 Dependence on renal dialysis: Secondary | ICD-10-CM | POA: Diagnosis not present

## 2019-04-13 DIAGNOSIS — N186 End stage renal disease: Secondary | ICD-10-CM | POA: Diagnosis not present

## 2019-04-15 DIAGNOSIS — Z1159 Encounter for screening for other viral diseases: Secondary | ICD-10-CM | POA: Diagnosis not present

## 2019-04-16 DIAGNOSIS — Z992 Dependence on renal dialysis: Secondary | ICD-10-CM | POA: Diagnosis not present

## 2019-04-16 DIAGNOSIS — N2581 Secondary hyperparathyroidism of renal origin: Secondary | ICD-10-CM | POA: Diagnosis not present

## 2019-04-16 DIAGNOSIS — E875 Hyperkalemia: Secondary | ICD-10-CM | POA: Diagnosis not present

## 2019-04-16 DIAGNOSIS — N186 End stage renal disease: Secondary | ICD-10-CM | POA: Diagnosis not present

## 2019-04-18 DIAGNOSIS — N049 Nephrotic syndrome with unspecified morphologic changes: Secondary | ICD-10-CM | POA: Diagnosis not present

## 2019-04-18 DIAGNOSIS — E875 Hyperkalemia: Secondary | ICD-10-CM | POA: Diagnosis not present

## 2019-04-18 DIAGNOSIS — N186 End stage renal disease: Secondary | ICD-10-CM | POA: Diagnosis not present

## 2019-04-18 DIAGNOSIS — Z992 Dependence on renal dialysis: Secondary | ICD-10-CM | POA: Diagnosis not present

## 2019-04-18 DIAGNOSIS — N2581 Secondary hyperparathyroidism of renal origin: Secondary | ICD-10-CM | POA: Diagnosis not present

## 2019-04-20 DIAGNOSIS — E875 Hyperkalemia: Secondary | ICD-10-CM | POA: Diagnosis not present

## 2019-04-20 DIAGNOSIS — Z992 Dependence on renal dialysis: Secondary | ICD-10-CM | POA: Diagnosis not present

## 2019-04-20 DIAGNOSIS — N186 End stage renal disease: Secondary | ICD-10-CM | POA: Diagnosis not present

## 2019-04-20 DIAGNOSIS — N2581 Secondary hyperparathyroidism of renal origin: Secondary | ICD-10-CM | POA: Diagnosis not present

## 2019-04-23 DIAGNOSIS — N186 End stage renal disease: Secondary | ICD-10-CM | POA: Diagnosis not present

## 2019-04-23 DIAGNOSIS — Z992 Dependence on renal dialysis: Secondary | ICD-10-CM | POA: Diagnosis not present

## 2019-04-27 DIAGNOSIS — Z992 Dependence on renal dialysis: Secondary | ICD-10-CM | POA: Diagnosis not present

## 2019-04-27 DIAGNOSIS — N186 End stage renal disease: Secondary | ICD-10-CM | POA: Diagnosis not present

## 2019-04-30 DIAGNOSIS — N2581 Secondary hyperparathyroidism of renal origin: Secondary | ICD-10-CM | POA: Diagnosis not present

## 2019-04-30 DIAGNOSIS — E875 Hyperkalemia: Secondary | ICD-10-CM | POA: Diagnosis not present

## 2019-04-30 DIAGNOSIS — N186 End stage renal disease: Secondary | ICD-10-CM | POA: Diagnosis not present

## 2019-04-30 DIAGNOSIS — Z992 Dependence on renal dialysis: Secondary | ICD-10-CM | POA: Diagnosis not present

## 2019-05-01 DIAGNOSIS — I871 Compression of vein: Secondary | ICD-10-CM | POA: Diagnosis not present

## 2019-05-01 DIAGNOSIS — Z992 Dependence on renal dialysis: Secondary | ICD-10-CM | POA: Diagnosis not present

## 2019-05-01 DIAGNOSIS — N186 End stage renal disease: Secondary | ICD-10-CM | POA: Diagnosis not present

## 2019-05-01 DIAGNOSIS — T82858A Stenosis of vascular prosthetic devices, implants and grafts, initial encounter: Secondary | ICD-10-CM | POA: Diagnosis not present

## 2019-05-02 DIAGNOSIS — N186 End stage renal disease: Secondary | ICD-10-CM | POA: Diagnosis not present

## 2019-05-02 DIAGNOSIS — N2581 Secondary hyperparathyroidism of renal origin: Secondary | ICD-10-CM | POA: Diagnosis not present

## 2019-05-02 DIAGNOSIS — E875 Hyperkalemia: Secondary | ICD-10-CM | POA: Diagnosis not present

## 2019-05-02 DIAGNOSIS — Z992 Dependence on renal dialysis: Secondary | ICD-10-CM | POA: Diagnosis not present

## 2019-05-04 DIAGNOSIS — E875 Hyperkalemia: Secondary | ICD-10-CM | POA: Diagnosis not present

## 2019-05-04 DIAGNOSIS — N186 End stage renal disease: Secondary | ICD-10-CM | POA: Diagnosis not present

## 2019-05-04 DIAGNOSIS — N2581 Secondary hyperparathyroidism of renal origin: Secondary | ICD-10-CM | POA: Diagnosis not present

## 2019-05-04 DIAGNOSIS — Z992 Dependence on renal dialysis: Secondary | ICD-10-CM | POA: Diagnosis not present

## 2019-05-07 DIAGNOSIS — E875 Hyperkalemia: Secondary | ICD-10-CM | POA: Diagnosis not present

## 2019-05-07 DIAGNOSIS — N186 End stage renal disease: Secondary | ICD-10-CM | POA: Diagnosis not present

## 2019-05-07 DIAGNOSIS — Z992 Dependence on renal dialysis: Secondary | ICD-10-CM | POA: Diagnosis not present

## 2019-05-07 DIAGNOSIS — N2581 Secondary hyperparathyroidism of renal origin: Secondary | ICD-10-CM | POA: Diagnosis not present

## 2019-05-09 DIAGNOSIS — N2581 Secondary hyperparathyroidism of renal origin: Secondary | ICD-10-CM | POA: Diagnosis not present

## 2019-05-09 DIAGNOSIS — E875 Hyperkalemia: Secondary | ICD-10-CM | POA: Diagnosis not present

## 2019-05-09 DIAGNOSIS — N186 End stage renal disease: Secondary | ICD-10-CM | POA: Diagnosis not present

## 2019-05-09 DIAGNOSIS — Z992 Dependence on renal dialysis: Secondary | ICD-10-CM | POA: Diagnosis not present

## 2019-05-11 DIAGNOSIS — Z992 Dependence on renal dialysis: Secondary | ICD-10-CM | POA: Diagnosis not present

## 2019-05-11 DIAGNOSIS — E875 Hyperkalemia: Secondary | ICD-10-CM | POA: Diagnosis not present

## 2019-05-11 DIAGNOSIS — N186 End stage renal disease: Secondary | ICD-10-CM | POA: Diagnosis not present

## 2019-05-11 DIAGNOSIS — N2581 Secondary hyperparathyroidism of renal origin: Secondary | ICD-10-CM | POA: Diagnosis not present

## 2019-05-14 DIAGNOSIS — E875 Hyperkalemia: Secondary | ICD-10-CM | POA: Diagnosis not present

## 2019-05-14 DIAGNOSIS — N186 End stage renal disease: Secondary | ICD-10-CM | POA: Diagnosis not present

## 2019-05-14 DIAGNOSIS — Z992 Dependence on renal dialysis: Secondary | ICD-10-CM | POA: Diagnosis not present

## 2019-05-14 DIAGNOSIS — N2581 Secondary hyperparathyroidism of renal origin: Secondary | ICD-10-CM | POA: Diagnosis not present

## 2019-05-16 DIAGNOSIS — N2581 Secondary hyperparathyroidism of renal origin: Secondary | ICD-10-CM | POA: Diagnosis not present

## 2019-05-16 DIAGNOSIS — Z992 Dependence on renal dialysis: Secondary | ICD-10-CM | POA: Diagnosis not present

## 2019-05-16 DIAGNOSIS — N186 End stage renal disease: Secondary | ICD-10-CM | POA: Diagnosis not present

## 2019-05-16 DIAGNOSIS — E875 Hyperkalemia: Secondary | ICD-10-CM | POA: Diagnosis not present

## 2019-05-18 DIAGNOSIS — N186 End stage renal disease: Secondary | ICD-10-CM | POA: Diagnosis not present

## 2019-05-18 DIAGNOSIS — N2581 Secondary hyperparathyroidism of renal origin: Secondary | ICD-10-CM | POA: Diagnosis not present

## 2019-05-18 DIAGNOSIS — Z992 Dependence on renal dialysis: Secondary | ICD-10-CM | POA: Diagnosis not present

## 2019-05-18 DIAGNOSIS — E875 Hyperkalemia: Secondary | ICD-10-CM | POA: Diagnosis not present

## 2019-05-19 DIAGNOSIS — N049 Nephrotic syndrome with unspecified morphologic changes: Secondary | ICD-10-CM | POA: Diagnosis not present

## 2019-05-19 DIAGNOSIS — N186 End stage renal disease: Secondary | ICD-10-CM | POA: Diagnosis not present

## 2019-05-19 DIAGNOSIS — Z992 Dependence on renal dialysis: Secondary | ICD-10-CM | POA: Diagnosis not present

## 2019-05-21 DIAGNOSIS — D631 Anemia in chronic kidney disease: Secondary | ICD-10-CM | POA: Diagnosis not present

## 2019-05-21 DIAGNOSIS — Z992 Dependence on renal dialysis: Secondary | ICD-10-CM | POA: Diagnosis not present

## 2019-05-21 DIAGNOSIS — N2581 Secondary hyperparathyroidism of renal origin: Secondary | ICD-10-CM | POA: Diagnosis not present

## 2019-05-21 DIAGNOSIS — E875 Hyperkalemia: Secondary | ICD-10-CM | POA: Diagnosis not present

## 2019-05-21 DIAGNOSIS — N186 End stage renal disease: Secondary | ICD-10-CM | POA: Diagnosis not present

## 2019-05-23 DIAGNOSIS — N2581 Secondary hyperparathyroidism of renal origin: Secondary | ICD-10-CM | POA: Diagnosis not present

## 2019-05-23 DIAGNOSIS — N186 End stage renal disease: Secondary | ICD-10-CM | POA: Diagnosis not present

## 2019-05-23 DIAGNOSIS — E875 Hyperkalemia: Secondary | ICD-10-CM | POA: Diagnosis not present

## 2019-05-23 DIAGNOSIS — Z992 Dependence on renal dialysis: Secondary | ICD-10-CM | POA: Diagnosis not present

## 2019-05-23 DIAGNOSIS — D631 Anemia in chronic kidney disease: Secondary | ICD-10-CM | POA: Diagnosis not present

## 2019-05-25 DIAGNOSIS — Z992 Dependence on renal dialysis: Secondary | ICD-10-CM | POA: Diagnosis not present

## 2019-05-25 DIAGNOSIS — N186 End stage renal disease: Secondary | ICD-10-CM | POA: Diagnosis not present

## 2019-05-25 DIAGNOSIS — D631 Anemia in chronic kidney disease: Secondary | ICD-10-CM | POA: Diagnosis not present

## 2019-05-25 DIAGNOSIS — E875 Hyperkalemia: Secondary | ICD-10-CM | POA: Diagnosis not present

## 2019-05-25 DIAGNOSIS — N2581 Secondary hyperparathyroidism of renal origin: Secondary | ICD-10-CM | POA: Diagnosis not present

## 2019-05-28 DIAGNOSIS — N186 End stage renal disease: Secondary | ICD-10-CM | POA: Diagnosis not present

## 2019-05-28 DIAGNOSIS — D631 Anemia in chronic kidney disease: Secondary | ICD-10-CM | POA: Diagnosis not present

## 2019-05-28 DIAGNOSIS — E875 Hyperkalemia: Secondary | ICD-10-CM | POA: Diagnosis not present

## 2019-05-28 DIAGNOSIS — N2581 Secondary hyperparathyroidism of renal origin: Secondary | ICD-10-CM | POA: Diagnosis not present

## 2019-05-28 DIAGNOSIS — Z992 Dependence on renal dialysis: Secondary | ICD-10-CM | POA: Diagnosis not present

## 2019-05-30 DIAGNOSIS — Z992 Dependence on renal dialysis: Secondary | ICD-10-CM | POA: Diagnosis not present

## 2019-05-30 DIAGNOSIS — E875 Hyperkalemia: Secondary | ICD-10-CM | POA: Diagnosis not present

## 2019-05-30 DIAGNOSIS — D631 Anemia in chronic kidney disease: Secondary | ICD-10-CM | POA: Diagnosis not present

## 2019-05-30 DIAGNOSIS — N186 End stage renal disease: Secondary | ICD-10-CM | POA: Diagnosis not present

## 2019-05-30 DIAGNOSIS — N2581 Secondary hyperparathyroidism of renal origin: Secondary | ICD-10-CM | POA: Diagnosis not present

## 2019-06-01 DIAGNOSIS — Z992 Dependence on renal dialysis: Secondary | ICD-10-CM | POA: Diagnosis not present

## 2019-06-01 DIAGNOSIS — N2581 Secondary hyperparathyroidism of renal origin: Secondary | ICD-10-CM | POA: Diagnosis not present

## 2019-06-01 DIAGNOSIS — D631 Anemia in chronic kidney disease: Secondary | ICD-10-CM | POA: Diagnosis not present

## 2019-06-01 DIAGNOSIS — N186 End stage renal disease: Secondary | ICD-10-CM | POA: Diagnosis not present

## 2019-06-01 DIAGNOSIS — E875 Hyperkalemia: Secondary | ICD-10-CM | POA: Diagnosis not present

## 2019-06-04 DIAGNOSIS — E875 Hyperkalemia: Secondary | ICD-10-CM | POA: Diagnosis not present

## 2019-06-04 DIAGNOSIS — N186 End stage renal disease: Secondary | ICD-10-CM | POA: Diagnosis not present

## 2019-06-04 DIAGNOSIS — N2581 Secondary hyperparathyroidism of renal origin: Secondary | ICD-10-CM | POA: Diagnosis not present

## 2019-06-04 DIAGNOSIS — D631 Anemia in chronic kidney disease: Secondary | ICD-10-CM | POA: Diagnosis not present

## 2019-06-04 DIAGNOSIS — Z992 Dependence on renal dialysis: Secondary | ICD-10-CM | POA: Diagnosis not present

## 2019-06-06 DIAGNOSIS — E875 Hyperkalemia: Secondary | ICD-10-CM | POA: Diagnosis not present

## 2019-06-06 DIAGNOSIS — N186 End stage renal disease: Secondary | ICD-10-CM | POA: Diagnosis not present

## 2019-06-06 DIAGNOSIS — Z992 Dependence on renal dialysis: Secondary | ICD-10-CM | POA: Diagnosis not present

## 2019-06-06 DIAGNOSIS — N2581 Secondary hyperparathyroidism of renal origin: Secondary | ICD-10-CM | POA: Diagnosis not present

## 2019-06-06 DIAGNOSIS — D631 Anemia in chronic kidney disease: Secondary | ICD-10-CM | POA: Diagnosis not present

## 2019-06-08 DIAGNOSIS — N186 End stage renal disease: Secondary | ICD-10-CM | POA: Diagnosis not present

## 2019-06-08 DIAGNOSIS — Z992 Dependence on renal dialysis: Secondary | ICD-10-CM | POA: Diagnosis not present

## 2019-06-08 DIAGNOSIS — E875 Hyperkalemia: Secondary | ICD-10-CM | POA: Diagnosis not present

## 2019-06-08 DIAGNOSIS — N2581 Secondary hyperparathyroidism of renal origin: Secondary | ICD-10-CM | POA: Diagnosis not present

## 2019-06-08 DIAGNOSIS — D631 Anemia in chronic kidney disease: Secondary | ICD-10-CM | POA: Diagnosis not present

## 2019-06-10 DIAGNOSIS — D631 Anemia in chronic kidney disease: Secondary | ICD-10-CM | POA: Diagnosis not present

## 2019-06-10 DIAGNOSIS — Z992 Dependence on renal dialysis: Secondary | ICD-10-CM | POA: Diagnosis not present

## 2019-06-10 DIAGNOSIS — N186 End stage renal disease: Secondary | ICD-10-CM | POA: Diagnosis not present

## 2019-06-10 DIAGNOSIS — E875 Hyperkalemia: Secondary | ICD-10-CM | POA: Diagnosis not present

## 2019-06-10 DIAGNOSIS — N2581 Secondary hyperparathyroidism of renal origin: Secondary | ICD-10-CM | POA: Diagnosis not present

## 2019-06-12 DIAGNOSIS — N2581 Secondary hyperparathyroidism of renal origin: Secondary | ICD-10-CM | POA: Diagnosis not present

## 2019-06-12 DIAGNOSIS — E875 Hyperkalemia: Secondary | ICD-10-CM | POA: Diagnosis not present

## 2019-06-12 DIAGNOSIS — D631 Anemia in chronic kidney disease: Secondary | ICD-10-CM | POA: Diagnosis not present

## 2019-06-12 DIAGNOSIS — N186 End stage renal disease: Secondary | ICD-10-CM | POA: Diagnosis not present

## 2019-06-12 DIAGNOSIS — Z992 Dependence on renal dialysis: Secondary | ICD-10-CM | POA: Diagnosis not present

## 2019-06-15 DIAGNOSIS — D631 Anemia in chronic kidney disease: Secondary | ICD-10-CM | POA: Diagnosis not present

## 2019-06-15 DIAGNOSIS — N2581 Secondary hyperparathyroidism of renal origin: Secondary | ICD-10-CM | POA: Diagnosis not present

## 2019-06-15 DIAGNOSIS — E875 Hyperkalemia: Secondary | ICD-10-CM | POA: Diagnosis not present

## 2019-06-15 DIAGNOSIS — N186 End stage renal disease: Secondary | ICD-10-CM | POA: Diagnosis not present

## 2019-06-15 DIAGNOSIS — Z992 Dependence on renal dialysis: Secondary | ICD-10-CM | POA: Diagnosis not present

## 2019-06-17 DIAGNOSIS — E875 Hyperkalemia: Secondary | ICD-10-CM | POA: Diagnosis not present

## 2019-06-17 DIAGNOSIS — N2581 Secondary hyperparathyroidism of renal origin: Secondary | ICD-10-CM | POA: Diagnosis not present

## 2019-06-17 DIAGNOSIS — D631 Anemia in chronic kidney disease: Secondary | ICD-10-CM | POA: Diagnosis not present

## 2019-06-17 DIAGNOSIS — N186 End stage renal disease: Secondary | ICD-10-CM | POA: Diagnosis not present

## 2019-06-17 DIAGNOSIS — Z992 Dependence on renal dialysis: Secondary | ICD-10-CM | POA: Diagnosis not present

## 2019-06-20 DIAGNOSIS — Z992 Dependence on renal dialysis: Secondary | ICD-10-CM | POA: Diagnosis not present

## 2019-06-20 DIAGNOSIS — N2581 Secondary hyperparathyroidism of renal origin: Secondary | ICD-10-CM | POA: Diagnosis not present

## 2019-06-20 DIAGNOSIS — D631 Anemia in chronic kidney disease: Secondary | ICD-10-CM | POA: Diagnosis not present

## 2019-06-20 DIAGNOSIS — E875 Hyperkalemia: Secondary | ICD-10-CM | POA: Diagnosis not present

## 2019-06-20 DIAGNOSIS — N186 End stage renal disease: Secondary | ICD-10-CM | POA: Diagnosis not present

## 2019-06-20 DIAGNOSIS — T827XXA Infection and inflammatory reaction due to other cardiac and vascular devices, implants and grafts, initial encounter: Secondary | ICD-10-CM | POA: Diagnosis not present

## 2019-06-22 DIAGNOSIS — N2581 Secondary hyperparathyroidism of renal origin: Secondary | ICD-10-CM | POA: Diagnosis not present

## 2019-06-22 DIAGNOSIS — Z992 Dependence on renal dialysis: Secondary | ICD-10-CM | POA: Diagnosis not present

## 2019-06-22 DIAGNOSIS — N186 End stage renal disease: Secondary | ICD-10-CM | POA: Diagnosis not present

## 2019-06-22 DIAGNOSIS — T827XXA Infection and inflammatory reaction due to other cardiac and vascular devices, implants and grafts, initial encounter: Secondary | ICD-10-CM | POA: Diagnosis not present

## 2019-06-25 DIAGNOSIS — T827XXA Infection and inflammatory reaction due to other cardiac and vascular devices, implants and grafts, initial encounter: Secondary | ICD-10-CM | POA: Diagnosis not present

## 2019-06-25 DIAGNOSIS — Z992 Dependence on renal dialysis: Secondary | ICD-10-CM | POA: Diagnosis not present

## 2019-06-25 DIAGNOSIS — N186 End stage renal disease: Secondary | ICD-10-CM | POA: Diagnosis not present

## 2019-06-25 DIAGNOSIS — N2581 Secondary hyperparathyroidism of renal origin: Secondary | ICD-10-CM | POA: Diagnosis not present

## 2019-06-27 DIAGNOSIS — N186 End stage renal disease: Secondary | ICD-10-CM | POA: Diagnosis not present

## 2019-06-27 DIAGNOSIS — Z992 Dependence on renal dialysis: Secondary | ICD-10-CM | POA: Diagnosis not present

## 2019-06-27 DIAGNOSIS — N2581 Secondary hyperparathyroidism of renal origin: Secondary | ICD-10-CM | POA: Diagnosis not present

## 2019-06-27 DIAGNOSIS — T827XXA Infection and inflammatory reaction due to other cardiac and vascular devices, implants and grafts, initial encounter: Secondary | ICD-10-CM | POA: Diagnosis not present

## 2019-06-29 DIAGNOSIS — N2581 Secondary hyperparathyroidism of renal origin: Secondary | ICD-10-CM | POA: Diagnosis not present

## 2019-06-29 DIAGNOSIS — Z992 Dependence on renal dialysis: Secondary | ICD-10-CM | POA: Diagnosis not present

## 2019-06-29 DIAGNOSIS — N186 End stage renal disease: Secondary | ICD-10-CM | POA: Diagnosis not present

## 2019-06-29 DIAGNOSIS — T827XXA Infection and inflammatory reaction due to other cardiac and vascular devices, implants and grafts, initial encounter: Secondary | ICD-10-CM | POA: Diagnosis not present

## 2019-07-02 DIAGNOSIS — N2581 Secondary hyperparathyroidism of renal origin: Secondary | ICD-10-CM | POA: Diagnosis not present

## 2019-07-02 DIAGNOSIS — T827XXA Infection and inflammatory reaction due to other cardiac and vascular devices, implants and grafts, initial encounter: Secondary | ICD-10-CM | POA: Diagnosis not present

## 2019-07-02 DIAGNOSIS — Z992 Dependence on renal dialysis: Secondary | ICD-10-CM | POA: Diagnosis not present

## 2019-07-02 DIAGNOSIS — N186 End stage renal disease: Secondary | ICD-10-CM | POA: Diagnosis not present

## 2019-07-04 DIAGNOSIS — T827XXA Infection and inflammatory reaction due to other cardiac and vascular devices, implants and grafts, initial encounter: Secondary | ICD-10-CM | POA: Diagnosis not present

## 2019-07-04 DIAGNOSIS — N186 End stage renal disease: Secondary | ICD-10-CM | POA: Diagnosis not present

## 2019-07-04 DIAGNOSIS — N2581 Secondary hyperparathyroidism of renal origin: Secondary | ICD-10-CM | POA: Diagnosis not present

## 2019-07-04 DIAGNOSIS — Z992 Dependence on renal dialysis: Secondary | ICD-10-CM | POA: Diagnosis not present

## 2019-07-06 DIAGNOSIS — T827XXA Infection and inflammatory reaction due to other cardiac and vascular devices, implants and grafts, initial encounter: Secondary | ICD-10-CM | POA: Diagnosis not present

## 2019-07-06 DIAGNOSIS — N2581 Secondary hyperparathyroidism of renal origin: Secondary | ICD-10-CM | POA: Diagnosis not present

## 2019-07-06 DIAGNOSIS — Z992 Dependence on renal dialysis: Secondary | ICD-10-CM | POA: Diagnosis not present

## 2019-07-06 DIAGNOSIS — N186 End stage renal disease: Secondary | ICD-10-CM | POA: Diagnosis not present

## 2019-07-09 DIAGNOSIS — N2581 Secondary hyperparathyroidism of renal origin: Secondary | ICD-10-CM | POA: Diagnosis not present

## 2019-07-09 DIAGNOSIS — Z992 Dependence on renal dialysis: Secondary | ICD-10-CM | POA: Diagnosis not present

## 2019-07-09 DIAGNOSIS — T827XXA Infection and inflammatory reaction due to other cardiac and vascular devices, implants and grafts, initial encounter: Secondary | ICD-10-CM | POA: Diagnosis not present

## 2019-07-09 DIAGNOSIS — N186 End stage renal disease: Secondary | ICD-10-CM | POA: Diagnosis not present

## 2019-07-11 DIAGNOSIS — N2581 Secondary hyperparathyroidism of renal origin: Secondary | ICD-10-CM | POA: Diagnosis not present

## 2019-07-11 DIAGNOSIS — T827XXA Infection and inflammatory reaction due to other cardiac and vascular devices, implants and grafts, initial encounter: Secondary | ICD-10-CM | POA: Diagnosis not present

## 2019-07-11 DIAGNOSIS — Z992 Dependence on renal dialysis: Secondary | ICD-10-CM | POA: Diagnosis not present

## 2019-07-11 DIAGNOSIS — N186 End stage renal disease: Secondary | ICD-10-CM | POA: Diagnosis not present

## 2019-07-14 DIAGNOSIS — N2581 Secondary hyperparathyroidism of renal origin: Secondary | ICD-10-CM | POA: Diagnosis not present

## 2019-07-14 DIAGNOSIS — T827XXA Infection and inflammatory reaction due to other cardiac and vascular devices, implants and grafts, initial encounter: Secondary | ICD-10-CM | POA: Diagnosis not present

## 2019-07-14 DIAGNOSIS — Z992 Dependence on renal dialysis: Secondary | ICD-10-CM | POA: Diagnosis not present

## 2019-07-14 DIAGNOSIS — N186 End stage renal disease: Secondary | ICD-10-CM | POA: Diagnosis not present

## 2019-07-16 DIAGNOSIS — N186 End stage renal disease: Secondary | ICD-10-CM | POA: Diagnosis not present

## 2019-07-16 DIAGNOSIS — T827XXA Infection and inflammatory reaction due to other cardiac and vascular devices, implants and grafts, initial encounter: Secondary | ICD-10-CM | POA: Diagnosis not present

## 2019-07-16 DIAGNOSIS — N2581 Secondary hyperparathyroidism of renal origin: Secondary | ICD-10-CM | POA: Diagnosis not present

## 2019-07-16 DIAGNOSIS — Z992 Dependence on renal dialysis: Secondary | ICD-10-CM | POA: Diagnosis not present

## 2019-07-18 DIAGNOSIS — N2581 Secondary hyperparathyroidism of renal origin: Secondary | ICD-10-CM | POA: Diagnosis not present

## 2019-07-18 DIAGNOSIS — N186 End stage renal disease: Secondary | ICD-10-CM | POA: Diagnosis not present

## 2019-07-18 DIAGNOSIS — Z992 Dependence on renal dialysis: Secondary | ICD-10-CM | POA: Diagnosis not present

## 2019-07-18 DIAGNOSIS — T827XXA Infection and inflammatory reaction due to other cardiac and vascular devices, implants and grafts, initial encounter: Secondary | ICD-10-CM | POA: Diagnosis not present

## 2019-07-19 DIAGNOSIS — Z992 Dependence on renal dialysis: Secondary | ICD-10-CM | POA: Diagnosis not present

## 2019-07-19 DIAGNOSIS — N049 Nephrotic syndrome with unspecified morphologic changes: Secondary | ICD-10-CM | POA: Diagnosis not present

## 2019-07-19 DIAGNOSIS — N186 End stage renal disease: Secondary | ICD-10-CM | POA: Diagnosis not present

## 2019-07-21 DIAGNOSIS — Z992 Dependence on renal dialysis: Secondary | ICD-10-CM | POA: Diagnosis not present

## 2019-07-21 DIAGNOSIS — T827XXA Infection and inflammatory reaction due to other cardiac and vascular devices, implants and grafts, initial encounter: Secondary | ICD-10-CM | POA: Diagnosis not present

## 2019-07-21 DIAGNOSIS — N2581 Secondary hyperparathyroidism of renal origin: Secondary | ICD-10-CM | POA: Diagnosis not present

## 2019-07-21 DIAGNOSIS — N186 End stage renal disease: Secondary | ICD-10-CM | POA: Diagnosis not present

## 2019-07-21 DIAGNOSIS — E875 Hyperkalemia: Secondary | ICD-10-CM | POA: Diagnosis not present

## 2019-07-23 DIAGNOSIS — Z992 Dependence on renal dialysis: Secondary | ICD-10-CM | POA: Diagnosis not present

## 2019-07-23 DIAGNOSIS — T827XXA Infection and inflammatory reaction due to other cardiac and vascular devices, implants and grafts, initial encounter: Secondary | ICD-10-CM | POA: Diagnosis not present

## 2019-07-23 DIAGNOSIS — E875 Hyperkalemia: Secondary | ICD-10-CM | POA: Diagnosis not present

## 2019-07-23 DIAGNOSIS — N2581 Secondary hyperparathyroidism of renal origin: Secondary | ICD-10-CM | POA: Diagnosis not present

## 2019-07-23 DIAGNOSIS — N186 End stage renal disease: Secondary | ICD-10-CM | POA: Diagnosis not present

## 2019-07-24 ENCOUNTER — Other Ambulatory Visit: Payer: Self-pay | Admitting: *Deleted

## 2019-07-24 ENCOUNTER — Other Ambulatory Visit: Payer: Self-pay

## 2019-07-24 ENCOUNTER — Ambulatory Visit (HOSPITAL_COMMUNITY)
Admission: RE | Admit: 2019-07-24 | Discharge: 2019-07-24 | Disposition: A | Discharge status: Home / Self Care | Payer: Medicare Other | Source: Ambulatory Visit | Attending: Vascular Surgery | Admitting: Vascular Surgery

## 2019-07-24 ENCOUNTER — Ambulatory Visit (INDEPENDENT_AMBULATORY_CARE_PROVIDER_SITE_OTHER): Payer: Medicare Other | Admitting: Vascular Surgery

## 2019-07-24 ENCOUNTER — Encounter: Payer: Self-pay | Admitting: Vascular Surgery

## 2019-07-24 VITALS — BP 121/68 | HR 59 | Temp 97.2°F | Resp 16 | Ht 66.0 in | Wt 150.0 lb

## 2019-07-24 DIAGNOSIS — Z992 Dependence on renal dialysis: Secondary | ICD-10-CM

## 2019-07-24 DIAGNOSIS — N186 End stage renal disease: Secondary | ICD-10-CM

## 2019-07-24 NOTE — Progress Notes (Signed)
Patient name: Rodney Bridges MRN: [id number] DOB: May 22, 1946 Sex: male  REASON FOR VISIT:   Infected left forearm AVF. The consult is requested by Ernest Haber PA  HPI:   Rodney Bridges is a pleasant 74 y.o. male who was referred with an infection in the left forearm.  I have reviewed the records that were sent with the patient.  It appears that they sent blood cultures and gave him a dose of vancomycin on 07/16/2019.  He was sent for evaluation of his left forearm AV fistula.  He had a left radiocephalic fistula placed in August 2017.  He has been using that without difficulty but developed some redness overlying the central portion of the fistula.  He has had blood culture sent and has received 3 doses of vancomycin.  I am unable to locate the results of his blood cultures.  He denies any significant pain with his left forearm fistula.  He does have some pruritus and does scratch this occasionally.  Past Medical History:  Diagnosis Date  . Anemia   . Chronic kidney disease, stage IV (severe) ([place])   . Dialysis patient [facility]) tues thurs sat  . Erectile dysfunction   . Head injury, closed, with concussion    x 2 in High School  . Hyperkalemia   . Hyperlipidemia   . Hypertension   . Proteinuria   . Renal tubular acidosis, type 1   . Secondary hyperparathyroidism of renal origin [facility])     Family History  Problem Relation Age of Onset  . Hypertension Father   . Diabetes Father     SOCIAL HISTORY: Social History   Tobacco Use  . Smoking status: Former Smoker    Packs/day: 0.50    Years: 10.00    Pack years: 5.00    Quit date: 07/18/1989    Years since quitting: 30.0  . Smokeless tobacco: Never Used  . Tobacco comment: 1/2 pack a day- when smoked  Substance Use Topics  . Alcohol use: Yes    Alcohol/week: 2.0 standard drinks    Types: 1 Glasses of wine, 1 Cans of beer per week    Comment: one drink of either once a week    Allergies  Allergen Reactions  . Bee  Venom Swelling    Swells badly with bee stings (WELTS at site and lightheadedness)  . Prednisone Swelling and Other (See Comments)    Difficulty swallowing/reported by patient  . Shellfish-Derived Products Hives, Rash and Other (See Comments)    Gets light-headed, as well   . Shrimp [Shellfish Allergy] Hives and Rash    Current Outpatient Medications  Medication Sig Dispense Refill  . albuterol (PROVENTIL HFA;VENTOLIN HFA) 108 (90 Base) MCG/ACT inhaler Inhale 2 puffs into the lungs every 6 (six) hours as needed for wheezing or shortness of breath. 1 Inhaler 2  . allopurinol (ZYLOPRIM) 100 MG tablet Take 100 mg by mouth daily.  5  . amLODipine (NORVASC) 10 MG tablet Take 10 mg by mouth at bedtime.   1  . aspirin 325 MG tablet Take 1 tablet (325 mg total) by mouth daily.    Marland Kitchen atorvastatin (LIPITOR) 20 MG tablet Take 20 mg by mouth daily. Reported on 01/13/2016    . calcitRIOL (ROCALTROL) 0.25 MCG capsule Take 2 capsules (0.5 mcg total) by mouth daily. Reported on 01/13/2016    . carvedilol (COREG) 12.5 MG tablet Take 12.5 mg by mouth See admin instructions. Take 12.5 mg by mouth two times a day on Sun/Mon/Wed/Fri  and 12.5 mg in the evening on Tues/Thurs/Sat  1  . ferric citrate (AURYXIA) 1 GM 210 MG(Fe) tablet Take 420 mg by mouth 3 (three) times daily with meals.    Marland Kitchen labetalol (NORMODYNE) 100 MG tablet Take 100 mg by mouth 2 (two) times daily.    Marland Kitchen losartan (COZAAR) 25 MG tablet Take 1 tablet (25 mg total) by mouth daily. 30 tablet 0  . multivitamin (RENA-VIT) TABS tablet Take 1 tablet by mouth daily.    . sevelamer carbonate (RENVELA) 800 MG tablet Take 1,600-2,400 mg by mouth See admin instructions. Take 2,400 mg by mouth three times a day with meals and 800-1,600 mg with snacks    . sodium bicarbonate 650 MG tablet Take 3 tablets (1,950 mg total) by mouth 2 (two) times daily. Reported on 01/13/2016     No current facility-administered medications for this visit.    REVIEW OF SYSTEMS:    [X]  denotes positive finding, [ ]  denotes negative finding Cardiac  Comments:  Chest pain or chest pressure:    Shortness of breath upon exertion:    Short of breath when lying flat:    Irregular heart rhythm:        Vascular    Pain in calf, thigh, or hip brought on by ambulation:    Pain in feet at night that wakes you up from your sleep:     Blood clot in your veins:    Leg swelling:         Pulmonary    Oxygen at home:    Productive cough:     Wheezing:         Neurologic    Sudden weakness in arms or legs:     Sudden numbness in arms or legs:     Sudden onset of difficulty speaking or slurred speech:    Temporary loss of vision in one eye:     Problems with dizziness:         Gastrointestinal    Blood in stool:     Vomited blood:         Genitourinary    Burning when urinating:     Blood in urine:        Psychiatric    Major depression:         Hematologic    Bleeding problems:    Problems with blood clotting too easily:        Skin    Rashes or ulcers:        Constitutional    Fever or chills:     PHYSICAL EXAM:   Vitals:   07/24/19 1017  BP: 121/68  Pulse: (!) 59  Resp: 16  Temp: (!) 97.2 F (36.2 C)  TempSrc: Temporal  SpO2: 100%  Weight: 150 lb (68 kg)  Height: 5\' 6"  (1.676 m)    GENERAL: The patient is a well-nourished male, in no acute distress. The vital signs are documented above. CARDIAC: There is a regular rate and rhythm.  VASCULAR: His fistula has an excellent thrill.   PULMONARY: There is good air exchange bilaterally without wheezing or rales.   DATA:    DUPLEX LEFT AV FISTULA: I have independently interpreted his duplex of his left AV fistula today.  No specific problems with the fistula are identified.  It is widely patent without any areas of stenosis.  There is no fluid around the fistula and no evidence of an abscess.  MEDICAL ISSUES:   END-STAGE RENAL DISEASE:  I think it would be very unlikely that the fistula would  be infected as there is no prosthetic graft here.  He appears to have some rash overlying the fistula and I have encouraged him to keep his skin well lubricated.  He could consider seeing a dermatologist if this rash worsens.  I think the only alternative short of trying to avoid cannulating near this area would be to place a tunneled dialysis catheter and rest the fistula for now.  He is very much opposed to that.  I have recommended that they try to not cannulate near the area with the rash and I have encouraged him to keep his skin well lubricated and try to avoid scratching this area.  If this progresses I think her only option would be to place a catheter and rest his fistula.  Deitra Mayo [facility] [phone]

## 2019-07-25 DIAGNOSIS — T827XXA Infection and inflammatory reaction due to other cardiac and vascular devices, implants and grafts, initial encounter: Secondary | ICD-10-CM | POA: Diagnosis not present

## 2019-07-25 DIAGNOSIS — Z992 Dependence on renal dialysis: Secondary | ICD-10-CM | POA: Diagnosis not present

## 2019-07-25 DIAGNOSIS — N186 End stage renal disease: Secondary | ICD-10-CM | POA: Diagnosis not present

## 2019-07-25 DIAGNOSIS — E875 Hyperkalemia: Secondary | ICD-10-CM | POA: Diagnosis not present

## 2019-07-25 DIAGNOSIS — N2581 Secondary hyperparathyroidism of renal origin: Secondary | ICD-10-CM | POA: Diagnosis not present

## 2019-07-27 DIAGNOSIS — N186 End stage renal disease: Secondary | ICD-10-CM | POA: Diagnosis not present

## 2019-07-27 DIAGNOSIS — Z992 Dependence on renal dialysis: Secondary | ICD-10-CM | POA: Diagnosis not present

## 2019-07-27 DIAGNOSIS — T827XXA Infection and inflammatory reaction due to other cardiac and vascular devices, implants and grafts, initial encounter: Secondary | ICD-10-CM | POA: Diagnosis not present

## 2019-07-27 DIAGNOSIS — N2581 Secondary hyperparathyroidism of renal origin: Secondary | ICD-10-CM | POA: Diagnosis not present

## 2019-07-27 DIAGNOSIS — E875 Hyperkalemia: Secondary | ICD-10-CM | POA: Diagnosis not present

## 2019-07-29 DIAGNOSIS — E8770 Fluid overload, unspecified: Secondary | ICD-10-CM | POA: Diagnosis not present

## 2019-07-29 DIAGNOSIS — Z01818 Encounter for other preprocedural examination: Secondary | ICD-10-CM | POA: Diagnosis not present

## 2019-07-29 DIAGNOSIS — I120 Hypertensive chronic kidney disease with stage 5 chronic kidney disease or end stage renal disease: Secondary | ICD-10-CM | POA: Diagnosis present

## 2019-07-29 DIAGNOSIS — Z7982 Long term (current) use of aspirin: Secondary | ICD-10-CM | POA: Diagnosis not present

## 2019-07-29 DIAGNOSIS — Z87891 Personal history of nicotine dependence: Secondary | ICD-10-CM | POA: Diagnosis not present

## 2019-07-29 DIAGNOSIS — Z01810 Encounter for preprocedural cardiovascular examination: Secondary | ICD-10-CM | POA: Diagnosis not present

## 2019-07-29 DIAGNOSIS — I1311 Hypertensive heart and chronic kidney disease without heart failure, with stage 5 chronic kidney disease, or end stage renal disease: Secondary | ICD-10-CM | POA: Diagnosis not present

## 2019-07-29 DIAGNOSIS — K219 Gastro-esophageal reflux disease without esophagitis: Secondary | ICD-10-CM | POA: Diagnosis present

## 2019-07-29 DIAGNOSIS — N2889 Other specified disorders of kidney and ureter: Secondary | ICD-10-CM | POA: Diagnosis not present

## 2019-07-29 DIAGNOSIS — L299 Pruritus, unspecified: Secondary | ICD-10-CM | POA: Diagnosis present

## 2019-07-29 DIAGNOSIS — Z940 Kidney transplant status: Secondary | ICD-10-CM | POA: Diagnosis not present

## 2019-07-29 DIAGNOSIS — D649 Anemia, unspecified: Secondary | ICD-10-CM | POA: Diagnosis not present

## 2019-07-29 DIAGNOSIS — R791 Abnormal coagulation profile: Secondary | ICD-10-CM | POA: Diagnosis present

## 2019-07-29 DIAGNOSIS — Z4822 Encounter for aftercare following kidney transplant: Secondary | ICD-10-CM | POA: Diagnosis not present

## 2019-07-29 DIAGNOSIS — D62 Acute posthemorrhagic anemia: Secondary | ICD-10-CM | POA: Diagnosis present

## 2019-07-29 DIAGNOSIS — E875 Hyperkalemia: Secondary | ICD-10-CM | POA: Diagnosis not present

## 2019-07-29 DIAGNOSIS — Z5181 Encounter for therapeutic drug level monitoring: Secondary | ICD-10-CM | POA: Diagnosis not present

## 2019-07-29 DIAGNOSIS — E8809 Other disorders of plasma-protein metabolism, not elsewhere classified: Secondary | ICD-10-CM | POA: Diagnosis not present

## 2019-07-29 DIAGNOSIS — Z992 Dependence on renal dialysis: Secondary | ICD-10-CM | POA: Diagnosis not present

## 2019-07-29 DIAGNOSIS — E785 Hyperlipidemia, unspecified: Secondary | ICD-10-CM | POA: Diagnosis present

## 2019-07-29 DIAGNOSIS — D631 Anemia in chronic kidney disease: Secondary | ICD-10-CM | POA: Diagnosis not present

## 2019-07-29 DIAGNOSIS — Z20822 Contact with and (suspected) exposure to covid-19: Secondary | ICD-10-CM | POA: Diagnosis present

## 2019-07-29 DIAGNOSIS — T8619 Other complication of kidney transplant: Secondary | ICD-10-CM | POA: Diagnosis not present

## 2019-07-29 DIAGNOSIS — N186 End stage renal disease: Secondary | ICD-10-CM | POA: Diagnosis present

## 2019-07-29 DIAGNOSIS — N179 Acute kidney failure, unspecified: Secondary | ICD-10-CM | POA: Diagnosis not present

## 2019-07-29 DIAGNOSIS — Z79899 Other long term (current) drug therapy: Secondary | ICD-10-CM | POA: Diagnosis not present

## 2019-07-29 DIAGNOSIS — M109 Gout, unspecified: Secondary | ICD-10-CM | POA: Diagnosis present

## 2019-08-06 DIAGNOSIS — D849 Immunodeficiency, unspecified: Secondary | ICD-10-CM | POA: Diagnosis not present

## 2019-08-06 DIAGNOSIS — Z4822 Encounter for aftercare following kidney transplant: Secondary | ICD-10-CM | POA: Diagnosis not present

## 2019-08-06 DIAGNOSIS — E785 Hyperlipidemia, unspecified: Secondary | ICD-10-CM | POA: Diagnosis not present

## 2019-08-06 DIAGNOSIS — D649 Anemia, unspecified: Secondary | ICD-10-CM | POA: Diagnosis not present

## 2019-08-06 DIAGNOSIS — T8619 Other complication of kidney transplant: Secondary | ICD-10-CM | POA: Diagnosis not present

## 2019-08-06 DIAGNOSIS — Z7952 Long term (current) use of systemic steroids: Secondary | ICD-10-CM | POA: Diagnosis not present

## 2019-08-06 DIAGNOSIS — Z79899 Other long term (current) drug therapy: Secondary | ICD-10-CM | POA: Diagnosis not present

## 2019-08-06 DIAGNOSIS — I120 Hypertensive chronic kidney disease with stage 5 chronic kidney disease or end stage renal disease: Secondary | ICD-10-CM | POA: Diagnosis not present

## 2019-08-06 DIAGNOSIS — N186 End stage renal disease: Secondary | ICD-10-CM | POA: Diagnosis not present

## 2019-08-06 DIAGNOSIS — I10 Essential (primary) hypertension: Secondary | ICD-10-CM | POA: Diagnosis not present

## 2019-08-06 DIAGNOSIS — D72829 Elevated white blood cell count, unspecified: Secondary | ICD-10-CM | POA: Diagnosis not present

## 2019-08-06 DIAGNOSIS — Z5181 Encounter for therapeutic drug level monitoring: Secondary | ICD-10-CM | POA: Diagnosis not present

## 2019-08-06 DIAGNOSIS — E8770 Fluid overload, unspecified: Secondary | ICD-10-CM | POA: Diagnosis not present

## 2019-08-06 DIAGNOSIS — D631 Anemia in chronic kidney disease: Secondary | ICD-10-CM | POA: Diagnosis not present

## 2019-08-06 DIAGNOSIS — Z792 Long term (current) use of antibiotics: Secondary | ICD-10-CM | POA: Diagnosis not present

## 2019-08-09 DIAGNOSIS — Z4822 Encounter for aftercare following kidney transplant: Secondary | ICD-10-CM | POA: Diagnosis not present

## 2019-08-09 DIAGNOSIS — I10 Essential (primary) hypertension: Secondary | ICD-10-CM | POA: Diagnosis not present

## 2019-08-09 DIAGNOSIS — Z5181 Encounter for therapeutic drug level monitoring: Secondary | ICD-10-CM | POA: Diagnosis not present

## 2019-08-09 DIAGNOSIS — Z79899 Other long term (current) drug therapy: Secondary | ICD-10-CM | POA: Diagnosis not present

## 2019-08-09 DIAGNOSIS — T8619 Other complication of kidney transplant: Secondary | ICD-10-CM | POA: Diagnosis not present

## 2019-08-09 DIAGNOSIS — D649 Anemia, unspecified: Secondary | ICD-10-CM | POA: Diagnosis not present

## 2019-08-09 DIAGNOSIS — Z940 Kidney transplant status: Secondary | ICD-10-CM | POA: Diagnosis not present

## 2019-08-09 DIAGNOSIS — K219 Gastro-esophageal reflux disease without esophagitis: Secondary | ICD-10-CM | POA: Diagnosis not present

## 2019-08-09 DIAGNOSIS — I129 Hypertensive chronic kidney disease with stage 1 through stage 4 chronic kidney disease, or unspecified chronic kidney disease: Secondary | ICD-10-CM | POA: Diagnosis not present

## 2019-08-09 DIAGNOSIS — D631 Anemia in chronic kidney disease: Secondary | ICD-10-CM | POA: Diagnosis not present

## 2019-08-09 DIAGNOSIS — D72829 Elevated white blood cell count, unspecified: Secondary | ICD-10-CM | POA: Diagnosis not present

## 2019-08-09 DIAGNOSIS — E785 Hyperlipidemia, unspecified: Secondary | ICD-10-CM | POA: Diagnosis not present

## 2019-08-09 DIAGNOSIS — Z7952 Long term (current) use of systemic steroids: Secondary | ICD-10-CM | POA: Diagnosis not present

## 2019-08-13 DIAGNOSIS — K219 Gastro-esophageal reflux disease without esophagitis: Secondary | ICD-10-CM | POA: Diagnosis not present

## 2019-08-13 DIAGNOSIS — Z7952 Long term (current) use of systemic steroids: Secondary | ICD-10-CM | POA: Diagnosis not present

## 2019-08-13 DIAGNOSIS — M109 Gout, unspecified: Secondary | ICD-10-CM | POA: Diagnosis not present

## 2019-08-13 DIAGNOSIS — Z79899 Other long term (current) drug therapy: Secondary | ICD-10-CM | POA: Diagnosis not present

## 2019-08-13 DIAGNOSIS — E785 Hyperlipidemia, unspecified: Secondary | ICD-10-CM | POA: Diagnosis not present

## 2019-08-13 DIAGNOSIS — Z4822 Encounter for aftercare following kidney transplant: Secondary | ICD-10-CM | POA: Diagnosis not present

## 2019-08-13 DIAGNOSIS — T451X5D Adverse effect of antineoplastic and immunosuppressive drugs, subsequent encounter: Secondary | ICD-10-CM | POA: Diagnosis not present

## 2019-08-13 DIAGNOSIS — R739 Hyperglycemia, unspecified: Secondary | ICD-10-CM | POA: Diagnosis not present

## 2019-08-13 DIAGNOSIS — Z5181 Encounter for therapeutic drug level monitoring: Secondary | ICD-10-CM | POA: Diagnosis not present

## 2019-08-13 DIAGNOSIS — I10 Essential (primary) hypertension: Secondary | ICD-10-CM | POA: Diagnosis not present

## 2019-08-13 DIAGNOSIS — D649 Anemia, unspecified: Secondary | ICD-10-CM | POA: Diagnosis not present

## 2019-08-13 DIAGNOSIS — D84821 Immunodeficiency due to drugs: Secondary | ICD-10-CM | POA: Diagnosis not present

## 2019-08-13 DIAGNOSIS — D72829 Elevated white blood cell count, unspecified: Secondary | ICD-10-CM | POA: Diagnosis not present

## 2019-08-13 DIAGNOSIS — Z940 Kidney transplant status: Secondary | ICD-10-CM | POA: Diagnosis not present

## 2019-08-13 DIAGNOSIS — E875 Hyperkalemia: Secondary | ICD-10-CM | POA: Diagnosis not present

## 2019-08-16 DIAGNOSIS — R9431 Abnormal electrocardiogram [ECG] [EKG]: Secondary | ICD-10-CM | POA: Diagnosis not present

## 2019-08-16 DIAGNOSIS — Z940 Kidney transplant status: Secondary | ICD-10-CM | POA: Diagnosis not present

## 2019-08-16 DIAGNOSIS — M109 Gout, unspecified: Secondary | ICD-10-CM | POA: Diagnosis not present

## 2019-08-16 DIAGNOSIS — I120 Hypertensive chronic kidney disease with stage 5 chronic kidney disease or end stage renal disease: Secondary | ICD-10-CM | POA: Diagnosis not present

## 2019-08-16 DIAGNOSIS — E785 Hyperlipidemia, unspecified: Secondary | ICD-10-CM | POA: Diagnosis not present

## 2019-08-16 DIAGNOSIS — K219 Gastro-esophageal reflux disease without esophagitis: Secondary | ICD-10-CM | POA: Diagnosis not present

## 2019-08-16 DIAGNOSIS — E875 Hyperkalemia: Secondary | ICD-10-CM | POA: Diagnosis not present

## 2019-08-16 DIAGNOSIS — R001 Bradycardia, unspecified: Secondary | ICD-10-CM | POA: Diagnosis not present

## 2019-08-16 DIAGNOSIS — N186 End stage renal disease: Secondary | ICD-10-CM | POA: Diagnosis not present

## 2019-08-16 DIAGNOSIS — Z4822 Encounter for aftercare following kidney transplant: Secondary | ICD-10-CM | POA: Diagnosis not present

## 2019-08-16 DIAGNOSIS — Z79899 Other long term (current) drug therapy: Secondary | ICD-10-CM | POA: Diagnosis not present

## 2019-08-17 DIAGNOSIS — E875 Hyperkalemia: Secondary | ICD-10-CM | POA: Diagnosis not present

## 2019-08-17 DIAGNOSIS — Z4822 Encounter for aftercare following kidney transplant: Secondary | ICD-10-CM | POA: Diagnosis not present

## 2019-08-17 DIAGNOSIS — Z79899 Other long term (current) drug therapy: Secondary | ICD-10-CM | POA: Diagnosis not present

## 2019-08-17 DIAGNOSIS — I120 Hypertensive chronic kidney disease with stage 5 chronic kidney disease or end stage renal disease: Secondary | ICD-10-CM | POA: Diagnosis not present

## 2019-08-17 DIAGNOSIS — M109 Gout, unspecified: Secondary | ICD-10-CM | POA: Diagnosis not present

## 2019-08-17 DIAGNOSIS — E785 Hyperlipidemia, unspecified: Secondary | ICD-10-CM | POA: Diagnosis not present

## 2019-08-17 DIAGNOSIS — N186 End stage renal disease: Secondary | ICD-10-CM | POA: Diagnosis not present

## 2019-08-17 DIAGNOSIS — K219 Gastro-esophageal reflux disease without esophagitis: Secondary | ICD-10-CM | POA: Diagnosis not present

## 2019-08-17 DIAGNOSIS — Z940 Kidney transplant status: Secondary | ICD-10-CM | POA: Diagnosis not present

## 2019-08-20 DIAGNOSIS — E785 Hyperlipidemia, unspecified: Secondary | ICD-10-CM | POA: Diagnosis not present

## 2019-08-20 DIAGNOSIS — D649 Anemia, unspecified: Secondary | ICD-10-CM | POA: Diagnosis not present

## 2019-08-20 DIAGNOSIS — T8619 Other complication of kidney transplant: Secondary | ICD-10-CM | POA: Diagnosis not present

## 2019-08-20 DIAGNOSIS — D849 Immunodeficiency, unspecified: Secondary | ICD-10-CM | POA: Diagnosis not present

## 2019-08-20 DIAGNOSIS — E872 Acidosis: Secondary | ICD-10-CM | POA: Diagnosis not present

## 2019-08-20 DIAGNOSIS — Z940 Kidney transplant status: Secondary | ICD-10-CM | POA: Diagnosis not present

## 2019-08-20 DIAGNOSIS — I10 Essential (primary) hypertension: Secondary | ICD-10-CM | POA: Diagnosis not present

## 2019-08-20 DIAGNOSIS — Z79899 Other long term (current) drug therapy: Secondary | ICD-10-CM | POA: Diagnosis not present

## 2019-08-20 DIAGNOSIS — Z466 Encounter for fitting and adjustment of urinary device: Secondary | ICD-10-CM | POA: Diagnosis not present

## 2019-08-20 DIAGNOSIS — Z792 Long term (current) use of antibiotics: Secondary | ICD-10-CM | POA: Diagnosis not present

## 2019-08-20 DIAGNOSIS — Z4822 Encounter for aftercare following kidney transplant: Secondary | ICD-10-CM | POA: Diagnosis not present

## 2019-08-20 DIAGNOSIS — Z5181 Encounter for therapeutic drug level monitoring: Secondary | ICD-10-CM | POA: Diagnosis not present

## 2019-08-20 DIAGNOSIS — E875 Hyperkalemia: Secondary | ICD-10-CM | POA: Diagnosis not present

## 2019-08-21 DIAGNOSIS — Z940 Kidney transplant status: Secondary | ICD-10-CM | POA: Diagnosis not present

## 2019-08-21 DIAGNOSIS — I517 Cardiomegaly: Secondary | ICD-10-CM | POA: Diagnosis not present

## 2019-08-21 DIAGNOSIS — Z7982 Long term (current) use of aspirin: Secondary | ICD-10-CM | POA: Diagnosis not present

## 2019-08-21 DIAGNOSIS — R001 Bradycardia, unspecified: Secondary | ICD-10-CM | POA: Diagnosis not present

## 2019-08-21 DIAGNOSIS — E875 Hyperkalemia: Secondary | ICD-10-CM | POA: Diagnosis present

## 2019-08-21 DIAGNOSIS — I120 Hypertensive chronic kidney disease with stage 5 chronic kidney disease or end stage renal disease: Secondary | ICD-10-CM | POA: Diagnosis not present

## 2019-08-21 DIAGNOSIS — N186 End stage renal disease: Secondary | ICD-10-CM | POA: Diagnosis not present

## 2019-08-21 DIAGNOSIS — Z4802 Encounter for removal of sutures: Secondary | ICD-10-CM | POA: Diagnosis not present

## 2019-08-21 DIAGNOSIS — Z4822 Encounter for aftercare following kidney transplant: Secondary | ICD-10-CM | POA: Diagnosis not present

## 2019-08-21 DIAGNOSIS — E785 Hyperlipidemia, unspecified: Secondary | ICD-10-CM | POA: Diagnosis present

## 2019-08-21 DIAGNOSIS — Z87891 Personal history of nicotine dependence: Secondary | ICD-10-CM | POA: Diagnosis not present

## 2019-08-21 DIAGNOSIS — R739 Hyperglycemia, unspecified: Secondary | ICD-10-CM | POA: Diagnosis present

## 2019-08-21 DIAGNOSIS — I10 Essential (primary) hypertension: Secondary | ICD-10-CM | POA: Diagnosis present

## 2019-08-21 DIAGNOSIS — Z5181 Encounter for therapeutic drug level monitoring: Secondary | ICD-10-CM | POA: Diagnosis not present

## 2019-08-22 DIAGNOSIS — N186 End stage renal disease: Secondary | ICD-10-CM | POA: Diagnosis not present

## 2019-08-22 DIAGNOSIS — E875 Hyperkalemia: Secondary | ICD-10-CM | POA: Diagnosis not present

## 2019-08-22 DIAGNOSIS — I120 Hypertensive chronic kidney disease with stage 5 chronic kidney disease or end stage renal disease: Secondary | ICD-10-CM | POA: Diagnosis not present

## 2019-08-23 DIAGNOSIS — I10 Essential (primary) hypertension: Secondary | ICD-10-CM | POA: Diagnosis not present

## 2019-08-23 DIAGNOSIS — Z940 Kidney transplant status: Secondary | ICD-10-CM | POA: Diagnosis not present

## 2019-08-23 DIAGNOSIS — Z7952 Long term (current) use of systemic steroids: Secondary | ICD-10-CM | POA: Diagnosis not present

## 2019-08-23 DIAGNOSIS — Z79899 Other long term (current) drug therapy: Secondary | ICD-10-CM | POA: Diagnosis not present

## 2019-08-23 DIAGNOSIS — E785 Hyperlipidemia, unspecified: Secondary | ICD-10-CM | POA: Diagnosis not present

## 2019-08-23 DIAGNOSIS — E875 Hyperkalemia: Secondary | ICD-10-CM | POA: Diagnosis not present

## 2019-08-23 DIAGNOSIS — Z4822 Encounter for aftercare following kidney transplant: Secondary | ICD-10-CM | POA: Diagnosis not present

## 2019-08-23 DIAGNOSIS — D649 Anemia, unspecified: Secondary | ICD-10-CM | POA: Diagnosis not present

## 2019-08-23 DIAGNOSIS — R739 Hyperglycemia, unspecified: Secondary | ICD-10-CM | POA: Diagnosis not present

## 2019-08-26 DIAGNOSIS — Z792 Long term (current) use of antibiotics: Secondary | ICD-10-CM | POA: Diagnosis not present

## 2019-08-26 DIAGNOSIS — E875 Hyperkalemia: Secondary | ICD-10-CM | POA: Diagnosis not present

## 2019-08-26 DIAGNOSIS — Z7952 Long term (current) use of systemic steroids: Secondary | ICD-10-CM | POA: Diagnosis not present

## 2019-08-26 DIAGNOSIS — D649 Anemia, unspecified: Secondary | ICD-10-CM | POA: Diagnosis not present

## 2019-08-26 DIAGNOSIS — E785 Hyperlipidemia, unspecified: Secondary | ICD-10-CM | POA: Diagnosis not present

## 2019-08-26 DIAGNOSIS — I10 Essential (primary) hypertension: Secondary | ICD-10-CM | POA: Diagnosis not present

## 2019-08-26 DIAGNOSIS — Z87891 Personal history of nicotine dependence: Secondary | ICD-10-CM | POA: Diagnosis not present

## 2019-08-26 DIAGNOSIS — R0602 Shortness of breath: Secondary | ICD-10-CM | POA: Diagnosis not present

## 2019-08-26 DIAGNOSIS — Z5181 Encounter for therapeutic drug level monitoring: Secondary | ICD-10-CM | POA: Diagnosis not present

## 2019-08-26 DIAGNOSIS — Z940 Kidney transplant status: Secondary | ICD-10-CM | POA: Diagnosis not present

## 2019-08-26 DIAGNOSIS — Z4822 Encounter for aftercare following kidney transplant: Secondary | ICD-10-CM | POA: Diagnosis not present

## 2019-08-26 DIAGNOSIS — Z79899 Other long term (current) drug therapy: Secondary | ICD-10-CM | POA: Diagnosis not present

## 2019-08-26 DIAGNOSIS — R739 Hyperglycemia, unspecified: Secondary | ICD-10-CM | POA: Diagnosis not present

## 2019-08-26 DIAGNOSIS — D849 Immunodeficiency, unspecified: Secondary | ICD-10-CM | POA: Diagnosis not present

## 2019-08-30 DIAGNOSIS — I10 Essential (primary) hypertension: Secondary | ICD-10-CM | POA: Diagnosis not present

## 2019-08-30 DIAGNOSIS — D6489 Other specified anemias: Secondary | ICD-10-CM | POA: Diagnosis not present

## 2019-08-30 DIAGNOSIS — N189 Chronic kidney disease, unspecified: Secondary | ICD-10-CM | POA: Diagnosis not present

## 2019-08-30 DIAGNOSIS — D849 Immunodeficiency, unspecified: Secondary | ICD-10-CM | POA: Diagnosis not present

## 2019-08-30 DIAGNOSIS — Z940 Kidney transplant status: Secondary | ICD-10-CM | POA: Diagnosis not present

## 2019-08-30 DIAGNOSIS — E875 Hyperkalemia: Secondary | ICD-10-CM | POA: Diagnosis not present

## 2019-08-30 DIAGNOSIS — K219 Gastro-esophageal reflux disease without esophagitis: Secondary | ICD-10-CM | POA: Diagnosis not present

## 2019-08-30 DIAGNOSIS — Z4822 Encounter for aftercare following kidney transplant: Secondary | ICD-10-CM | POA: Diagnosis not present

## 2019-08-30 DIAGNOSIS — Z79899 Other long term (current) drug therapy: Secondary | ICD-10-CM | POA: Diagnosis not present

## 2019-08-30 DIAGNOSIS — D631 Anemia in chronic kidney disease: Secondary | ICD-10-CM | POA: Diagnosis not present

## 2019-08-30 DIAGNOSIS — R739 Hyperglycemia, unspecified: Secondary | ICD-10-CM | POA: Diagnosis not present

## 2019-09-03 DIAGNOSIS — E1165 Type 2 diabetes mellitus with hyperglycemia: Secondary | ICD-10-CM | POA: Diagnosis not present

## 2019-09-03 DIAGNOSIS — D849 Immunodeficiency, unspecified: Secondary | ICD-10-CM | POA: Diagnosis not present

## 2019-09-03 DIAGNOSIS — Z79899 Other long term (current) drug therapy: Secondary | ICD-10-CM | POA: Diagnosis not present

## 2019-09-03 DIAGNOSIS — R0789 Other chest pain: Secondary | ICD-10-CM | POA: Diagnosis not present

## 2019-09-03 DIAGNOSIS — Z4822 Encounter for aftercare following kidney transplant: Secondary | ICD-10-CM | POA: Diagnosis not present

## 2019-09-03 DIAGNOSIS — Z940 Kidney transplant status: Secondary | ICD-10-CM | POA: Diagnosis not present

## 2019-09-03 DIAGNOSIS — I10 Essential (primary) hypertension: Secondary | ICD-10-CM | POA: Diagnosis not present

## 2019-09-03 DIAGNOSIS — Z5181 Encounter for therapeutic drug level monitoring: Secondary | ICD-10-CM | POA: Diagnosis not present

## 2019-09-10 DIAGNOSIS — Z4822 Encounter for aftercare following kidney transplant: Secondary | ICD-10-CM | POA: Diagnosis not present

## 2019-09-10 DIAGNOSIS — Z7982 Long term (current) use of aspirin: Secondary | ICD-10-CM | POA: Diagnosis not present

## 2019-09-10 DIAGNOSIS — I10 Essential (primary) hypertension: Secondary | ICD-10-CM | POA: Diagnosis not present

## 2019-09-10 DIAGNOSIS — E875 Hyperkalemia: Secondary | ICD-10-CM | POA: Diagnosis not present

## 2019-09-10 DIAGNOSIS — E785 Hyperlipidemia, unspecified: Secondary | ICD-10-CM | POA: Diagnosis not present

## 2019-09-10 DIAGNOSIS — Z7952 Long term (current) use of systemic steroids: Secondary | ICD-10-CM | POA: Diagnosis not present

## 2019-09-10 DIAGNOSIS — Z79899 Other long term (current) drug therapy: Secondary | ICD-10-CM | POA: Diagnosis not present

## 2019-09-11 DIAGNOSIS — I361 Nonrheumatic tricuspid (valve) insufficiency: Secondary | ICD-10-CM | POA: Diagnosis not present

## 2019-09-11 DIAGNOSIS — I348 Other nonrheumatic mitral valve disorders: Secondary | ICD-10-CM | POA: Diagnosis not present

## 2019-09-11 DIAGNOSIS — I340 Nonrheumatic mitral (valve) insufficiency: Secondary | ICD-10-CM | POA: Diagnosis not present

## 2019-09-11 DIAGNOSIS — I351 Nonrheumatic aortic (valve) insufficiency: Secondary | ICD-10-CM | POA: Diagnosis not present

## 2019-09-11 DIAGNOSIS — R0789 Other chest pain: Secondary | ICD-10-CM | POA: Diagnosis not present

## 2019-09-11 DIAGNOSIS — I358 Other nonrheumatic aortic valve disorders: Secondary | ICD-10-CM | POA: Diagnosis not present

## 2019-09-11 DIAGNOSIS — I517 Cardiomegaly: Secondary | ICD-10-CM | POA: Diagnosis not present

## 2019-09-17 DIAGNOSIS — E138 Other specified diabetes mellitus with unspecified complications: Secondary | ICD-10-CM | POA: Diagnosis not present

## 2019-09-17 DIAGNOSIS — R0789 Other chest pain: Secondary | ICD-10-CM | POA: Diagnosis not present

## 2019-09-17 DIAGNOSIS — Z7952 Long term (current) use of systemic steroids: Secondary | ICD-10-CM | POA: Diagnosis not present

## 2019-09-17 DIAGNOSIS — Z940 Kidney transplant status: Secondary | ICD-10-CM | POA: Diagnosis not present

## 2019-09-17 DIAGNOSIS — Z79899 Other long term (current) drug therapy: Secondary | ICD-10-CM | POA: Diagnosis not present

## 2019-09-17 DIAGNOSIS — D649 Anemia, unspecified: Secondary | ICD-10-CM | POA: Diagnosis not present

## 2019-09-17 DIAGNOSIS — I120 Hypertensive chronic kidney disease with stage 5 chronic kidney disease or end stage renal disease: Secondary | ICD-10-CM | POA: Diagnosis not present

## 2019-09-17 DIAGNOSIS — E785 Hyperlipidemia, unspecified: Secondary | ICD-10-CM | POA: Diagnosis not present

## 2019-09-17 DIAGNOSIS — R519 Headache, unspecified: Secondary | ICD-10-CM | POA: Diagnosis not present

## 2019-09-17 DIAGNOSIS — N186 End stage renal disease: Secondary | ICD-10-CM | POA: Diagnosis not present

## 2019-09-17 DIAGNOSIS — R739 Hyperglycemia, unspecified: Secondary | ICD-10-CM | POA: Diagnosis not present

## 2019-09-17 DIAGNOSIS — K219 Gastro-esophageal reflux disease without esophagitis: Secondary | ICD-10-CM | POA: Diagnosis not present

## 2019-09-17 DIAGNOSIS — D8989 Other specified disorders involving the immune mechanism, not elsewhere classified: Secondary | ICD-10-CM | POA: Diagnosis not present

## 2019-09-17 DIAGNOSIS — Z4822 Encounter for aftercare following kidney transplant: Secondary | ICD-10-CM | POA: Diagnosis not present

## 2019-09-17 DIAGNOSIS — I10 Essential (primary) hypertension: Secondary | ICD-10-CM | POA: Diagnosis not present

## 2019-09-17 DIAGNOSIS — E875 Hyperkalemia: Secondary | ICD-10-CM | POA: Diagnosis not present

## 2019-09-24 DIAGNOSIS — Z792 Long term (current) use of antibiotics: Secondary | ICD-10-CM | POA: Diagnosis not present

## 2019-09-24 DIAGNOSIS — D849 Immunodeficiency, unspecified: Secondary | ICD-10-CM | POA: Diagnosis not present

## 2019-09-24 DIAGNOSIS — Z4822 Encounter for aftercare following kidney transplant: Secondary | ICD-10-CM | POA: Diagnosis not present

## 2019-09-24 DIAGNOSIS — Z79899 Other long term (current) drug therapy: Secondary | ICD-10-CM | POA: Diagnosis not present

## 2019-09-24 DIAGNOSIS — R519 Headache, unspecified: Secondary | ICD-10-CM | POA: Diagnosis not present

## 2019-09-24 DIAGNOSIS — I10 Essential (primary) hypertension: Secondary | ICD-10-CM | POA: Diagnosis not present

## 2019-09-24 DIAGNOSIS — E785 Hyperlipidemia, unspecified: Secondary | ICD-10-CM | POA: Diagnosis not present

## 2019-09-24 DIAGNOSIS — D649 Anemia, unspecified: Secondary | ICD-10-CM | POA: Diagnosis not present

## 2019-09-24 DIAGNOSIS — Z7952 Long term (current) use of systemic steroids: Secondary | ICD-10-CM | POA: Diagnosis not present

## 2019-09-24 DIAGNOSIS — K219 Gastro-esophageal reflux disease without esophagitis: Secondary | ICD-10-CM | POA: Diagnosis not present

## 2019-09-24 DIAGNOSIS — R635 Abnormal weight gain: Secondary | ICD-10-CM | POA: Diagnosis not present

## 2019-09-24 DIAGNOSIS — Z6822 Body mass index (BMI) 22.0-22.9, adult: Secondary | ICD-10-CM | POA: Diagnosis not present

## 2019-09-24 DIAGNOSIS — Z5181 Encounter for therapeutic drug level monitoring: Secondary | ICD-10-CM | POA: Diagnosis not present

## 2019-09-24 DIAGNOSIS — R001 Bradycardia, unspecified: Secondary | ICD-10-CM | POA: Diagnosis not present

## 2019-09-24 DIAGNOSIS — Z940 Kidney transplant status: Secondary | ICD-10-CM | POA: Diagnosis not present

## 2019-09-24 DIAGNOSIS — R0789 Other chest pain: Secondary | ICD-10-CM | POA: Diagnosis not present

## 2019-09-24 DIAGNOSIS — E139 Other specified diabetes mellitus without complications: Secondary | ICD-10-CM | POA: Diagnosis not present

## 2019-09-24 DIAGNOSIS — R739 Hyperglycemia, unspecified: Secondary | ICD-10-CM | POA: Diagnosis not present

## 2019-09-24 DIAGNOSIS — E875 Hyperkalemia: Secondary | ICD-10-CM | POA: Diagnosis not present

## 2019-09-24 DIAGNOSIS — R600 Localized edema: Secondary | ICD-10-CM | POA: Diagnosis not present

## 2019-10-01 DIAGNOSIS — E139 Other specified diabetes mellitus without complications: Secondary | ICD-10-CM | POA: Diagnosis not present

## 2019-10-01 DIAGNOSIS — D649 Anemia, unspecified: Secondary | ICD-10-CM | POA: Diagnosis not present

## 2019-10-01 DIAGNOSIS — E875 Hyperkalemia: Secondary | ICD-10-CM | POA: Diagnosis not present

## 2019-10-01 DIAGNOSIS — Z5181 Encounter for therapeutic drug level monitoring: Secondary | ICD-10-CM | POA: Diagnosis not present

## 2019-10-01 DIAGNOSIS — Z7982 Long term (current) use of aspirin: Secondary | ICD-10-CM | POA: Diagnosis not present

## 2019-10-01 DIAGNOSIS — Z940 Kidney transplant status: Secondary | ICD-10-CM | POA: Diagnosis not present

## 2019-10-01 DIAGNOSIS — Z7984 Long term (current) use of oral hypoglycemic drugs: Secondary | ICD-10-CM | POA: Diagnosis not present

## 2019-10-01 DIAGNOSIS — Z7952 Long term (current) use of systemic steroids: Secondary | ICD-10-CM | POA: Diagnosis not present

## 2019-10-01 DIAGNOSIS — R600 Localized edema: Secondary | ICD-10-CM | POA: Diagnosis not present

## 2019-10-01 DIAGNOSIS — I10 Essential (primary) hypertension: Secondary | ICD-10-CM | POA: Diagnosis not present

## 2019-10-01 DIAGNOSIS — Z4822 Encounter for aftercare following kidney transplant: Secondary | ICD-10-CM | POA: Diagnosis not present

## 2019-10-01 DIAGNOSIS — Z792 Long term (current) use of antibiotics: Secondary | ICD-10-CM | POA: Diagnosis not present

## 2019-10-01 DIAGNOSIS — Z79899 Other long term (current) drug therapy: Secondary | ICD-10-CM | POA: Diagnosis not present

## 2019-10-01 DIAGNOSIS — R609 Edema, unspecified: Secondary | ICD-10-CM | POA: Diagnosis not present

## 2019-10-01 DIAGNOSIS — K5900 Constipation, unspecified: Secondary | ICD-10-CM | POA: Diagnosis not present

## 2019-10-01 DIAGNOSIS — K219 Gastro-esophageal reflux disease without esophagitis: Secondary | ICD-10-CM | POA: Diagnosis not present

## 2019-10-01 DIAGNOSIS — D849 Immunodeficiency, unspecified: Secondary | ICD-10-CM | POA: Diagnosis not present

## 2019-10-01 DIAGNOSIS — E872 Acidosis: Secondary | ICD-10-CM | POA: Diagnosis not present

## 2019-10-01 DIAGNOSIS — E891 Postprocedural hypoinsulinemia: Secondary | ICD-10-CM | POA: Diagnosis not present

## 2019-10-01 DIAGNOSIS — E785 Hyperlipidemia, unspecified: Secondary | ICD-10-CM | POA: Diagnosis not present

## 2019-10-02 DIAGNOSIS — T8619 Other complication of kidney transplant: Secondary | ICD-10-CM | POA: Diagnosis not present

## 2019-10-02 DIAGNOSIS — N1330 Unspecified hydronephrosis: Secondary | ICD-10-CM | POA: Diagnosis not present

## 2019-10-02 DIAGNOSIS — N1339 Other hydronephrosis: Secondary | ICD-10-CM | POA: Diagnosis not present

## 2019-10-02 DIAGNOSIS — Z940 Kidney transplant status: Secondary | ICD-10-CM | POA: Diagnosis not present

## 2019-10-02 DIAGNOSIS — R944 Abnormal results of kidney function studies: Secondary | ICD-10-CM | POA: Diagnosis not present

## 2019-10-02 DIAGNOSIS — E139 Other specified diabetes mellitus without complications: Secondary | ICD-10-CM | POA: Diagnosis not present

## 2019-10-03 DIAGNOSIS — N1339 Other hydronephrosis: Secondary | ICD-10-CM | POA: Diagnosis not present

## 2019-10-03 DIAGNOSIS — T8619 Other complication of kidney transplant: Secondary | ICD-10-CM | POA: Diagnosis not present

## 2019-10-03 DIAGNOSIS — Z940 Kidney transplant status: Secondary | ICD-10-CM | POA: Diagnosis not present

## 2019-10-03 DIAGNOSIS — E139 Other specified diabetes mellitus without complications: Secondary | ICD-10-CM | POA: Diagnosis not present

## 2019-10-08 DIAGNOSIS — Z4822 Encounter for aftercare following kidney transplant: Secondary | ICD-10-CM | POA: Diagnosis not present

## 2019-10-08 DIAGNOSIS — R739 Hyperglycemia, unspecified: Secondary | ICD-10-CM | POA: Diagnosis not present

## 2019-10-08 DIAGNOSIS — I10 Essential (primary) hypertension: Secondary | ICD-10-CM | POA: Diagnosis not present

## 2019-10-08 DIAGNOSIS — E875 Hyperkalemia: Secondary | ICD-10-CM | POA: Diagnosis not present

## 2019-10-08 DIAGNOSIS — E872 Acidosis: Secondary | ICD-10-CM | POA: Diagnosis not present

## 2019-10-08 DIAGNOSIS — Z7952 Long term (current) use of systemic steroids: Secondary | ICD-10-CM | POA: Diagnosis not present

## 2019-10-08 DIAGNOSIS — D849 Immunodeficiency, unspecified: Secondary | ICD-10-CM | POA: Diagnosis not present

## 2019-10-08 DIAGNOSIS — R0789 Other chest pain: Secondary | ICD-10-CM | POA: Diagnosis not present

## 2019-10-08 DIAGNOSIS — R519 Headache, unspecified: Secondary | ICD-10-CM | POA: Diagnosis not present

## 2019-10-08 DIAGNOSIS — I120 Hypertensive chronic kidney disease with stage 5 chronic kidney disease or end stage renal disease: Secondary | ICD-10-CM | POA: Diagnosis not present

## 2019-10-08 DIAGNOSIS — Z936 Other artificial openings of urinary tract status: Secondary | ICD-10-CM | POA: Diagnosis not present

## 2019-10-08 DIAGNOSIS — D649 Anemia, unspecified: Secondary | ICD-10-CM | POA: Diagnosis not present

## 2019-10-08 DIAGNOSIS — K219 Gastro-esophageal reflux disease without esophagitis: Secondary | ICD-10-CM | POA: Diagnosis not present

## 2019-10-08 DIAGNOSIS — N1339 Other hydronephrosis: Secondary | ICD-10-CM | POA: Diagnosis not present

## 2019-10-08 DIAGNOSIS — E785 Hyperlipidemia, unspecified: Secondary | ICD-10-CM | POA: Diagnosis not present

## 2019-10-08 DIAGNOSIS — Z940 Kidney transplant status: Secondary | ICD-10-CM | POA: Diagnosis not present

## 2019-10-08 DIAGNOSIS — N186 End stage renal disease: Secondary | ICD-10-CM | POA: Diagnosis not present

## 2019-10-08 DIAGNOSIS — Z79899 Other long term (current) drug therapy: Secondary | ICD-10-CM | POA: Diagnosis not present

## 2019-10-08 DIAGNOSIS — K5900 Constipation, unspecified: Secondary | ICD-10-CM | POA: Diagnosis not present

## 2019-10-08 DIAGNOSIS — E139 Other specified diabetes mellitus without complications: Secondary | ICD-10-CM | POA: Diagnosis not present

## 2019-10-15 DIAGNOSIS — Z5181 Encounter for therapeutic drug level monitoring: Secondary | ICD-10-CM | POA: Diagnosis not present

## 2019-10-15 DIAGNOSIS — Z936 Other artificial openings of urinary tract status: Secondary | ICD-10-CM | POA: Diagnosis not present

## 2019-10-15 DIAGNOSIS — D649 Anemia, unspecified: Secondary | ICD-10-CM | POA: Diagnosis not present

## 2019-10-15 DIAGNOSIS — Z4822 Encounter for aftercare following kidney transplant: Secondary | ICD-10-CM | POA: Diagnosis not present

## 2019-10-15 DIAGNOSIS — Z940 Kidney transplant status: Secondary | ICD-10-CM | POA: Diagnosis not present

## 2019-10-15 DIAGNOSIS — I10 Essential (primary) hypertension: Secondary | ICD-10-CM | POA: Diagnosis not present

## 2019-10-15 DIAGNOSIS — D849 Immunodeficiency, unspecified: Secondary | ICD-10-CM | POA: Diagnosis not present

## 2019-10-15 DIAGNOSIS — Z79899 Other long term (current) drug therapy: Secondary | ICD-10-CM | POA: Diagnosis not present

## 2019-10-15 DIAGNOSIS — K5900 Constipation, unspecified: Secondary | ICD-10-CM | POA: Diagnosis not present

## 2019-10-15 DIAGNOSIS — N186 End stage renal disease: Secondary | ICD-10-CM | POA: Diagnosis not present

## 2019-10-15 DIAGNOSIS — E872 Acidosis: Secondary | ICD-10-CM | POA: Diagnosis not present

## 2019-10-15 DIAGNOSIS — R519 Headache, unspecified: Secondary | ICD-10-CM | POA: Diagnosis not present

## 2019-10-15 DIAGNOSIS — E875 Hyperkalemia: Secondary | ICD-10-CM | POA: Diagnosis not present

## 2019-10-15 DIAGNOSIS — R739 Hyperglycemia, unspecified: Secondary | ICD-10-CM | POA: Diagnosis not present

## 2019-10-15 DIAGNOSIS — I120 Hypertensive chronic kidney disease with stage 5 chronic kidney disease or end stage renal disease: Secondary | ICD-10-CM | POA: Diagnosis not present

## 2019-10-15 DIAGNOSIS — E785 Hyperlipidemia, unspecified: Secondary | ICD-10-CM | POA: Diagnosis not present

## 2019-10-15 DIAGNOSIS — R0789 Other chest pain: Secondary | ICD-10-CM | POA: Diagnosis not present

## 2019-10-15 DIAGNOSIS — Z7952 Long term (current) use of systemic steroids: Secondary | ICD-10-CM | POA: Diagnosis not present

## 2019-10-15 DIAGNOSIS — E139 Other specified diabetes mellitus without complications: Secondary | ICD-10-CM | POA: Diagnosis not present

## 2019-10-17 DIAGNOSIS — Z466 Encounter for fitting and adjustment of urinary device: Secondary | ICD-10-CM | POA: Diagnosis not present

## 2019-10-17 DIAGNOSIS — Z436 Encounter for attention to other artificial openings of urinary tract: Secondary | ICD-10-CM | POA: Diagnosis not present

## 2019-10-17 DIAGNOSIS — Z940 Kidney transplant status: Secondary | ICD-10-CM | POA: Diagnosis not present

## 2019-10-17 DIAGNOSIS — N1330 Unspecified hydronephrosis: Secondary | ICD-10-CM | POA: Diagnosis not present

## 2019-10-21 DIAGNOSIS — Z940 Kidney transplant status: Secondary | ICD-10-CM | POA: Diagnosis not present

## 2019-10-21 DIAGNOSIS — N1330 Unspecified hydronephrosis: Secondary | ICD-10-CM | POA: Diagnosis not present

## 2019-10-21 DIAGNOSIS — Z466 Encounter for fitting and adjustment of urinary device: Secondary | ICD-10-CM | POA: Diagnosis not present

## 2019-10-22 DIAGNOSIS — Z940 Kidney transplant status: Secondary | ICD-10-CM | POA: Diagnosis not present

## 2019-10-22 DIAGNOSIS — Z79899 Other long term (current) drug therapy: Secondary | ICD-10-CM | POA: Diagnosis not present

## 2019-10-22 DIAGNOSIS — Z5181 Encounter for therapeutic drug level monitoring: Secondary | ICD-10-CM | POA: Diagnosis not present

## 2019-10-22 DIAGNOSIS — Z4822 Encounter for aftercare following kidney transplant: Secondary | ICD-10-CM | POA: Diagnosis not present

## 2019-10-22 DIAGNOSIS — I10 Essential (primary) hypertension: Secondary | ICD-10-CM | POA: Diagnosis not present

## 2019-10-22 DIAGNOSIS — E139 Other specified diabetes mellitus without complications: Secondary | ICD-10-CM | POA: Diagnosis not present

## 2019-10-22 DIAGNOSIS — T8619 Other complication of kidney transplant: Secondary | ICD-10-CM | POA: Diagnosis not present

## 2019-10-22 DIAGNOSIS — D849 Immunodeficiency, unspecified: Secondary | ICD-10-CM | POA: Diagnosis not present

## 2019-10-22 DIAGNOSIS — N1330 Unspecified hydronephrosis: Secondary | ICD-10-CM | POA: Diagnosis not present

## 2019-10-29 DIAGNOSIS — T8619 Other complication of kidney transplant: Secondary | ICD-10-CM | POA: Diagnosis not present

## 2019-10-29 DIAGNOSIS — Z936 Other artificial openings of urinary tract status: Secondary | ICD-10-CM | POA: Diagnosis not present

## 2019-10-29 DIAGNOSIS — I10 Essential (primary) hypertension: Secondary | ICD-10-CM | POA: Diagnosis not present

## 2019-10-29 DIAGNOSIS — M7918 Myalgia, other site: Secondary | ICD-10-CM | POA: Diagnosis not present

## 2019-10-29 DIAGNOSIS — E139 Other specified diabetes mellitus without complications: Secondary | ICD-10-CM | POA: Diagnosis not present

## 2019-10-29 DIAGNOSIS — R300 Dysuria: Secondary | ICD-10-CM | POA: Diagnosis not present

## 2019-10-29 DIAGNOSIS — Z7984 Long term (current) use of oral hypoglycemic drugs: Secondary | ICD-10-CM | POA: Diagnosis not present

## 2019-10-29 DIAGNOSIS — Z888 Allergy status to other drugs, medicaments and biological substances status: Secondary | ICD-10-CM | POA: Diagnosis not present

## 2019-10-29 DIAGNOSIS — Z4822 Encounter for aftercare following kidney transplant: Secondary | ICD-10-CM | POA: Diagnosis not present

## 2019-10-29 DIAGNOSIS — E119 Type 2 diabetes mellitus without complications: Secondary | ICD-10-CM | POA: Diagnosis not present

## 2019-10-29 DIAGNOSIS — D849 Immunodeficiency, unspecified: Secondary | ICD-10-CM | POA: Diagnosis not present

## 2019-10-29 DIAGNOSIS — Z79899 Other long term (current) drug therapy: Secondary | ICD-10-CM | POA: Diagnosis not present

## 2019-10-29 DIAGNOSIS — Z940 Kidney transplant status: Secondary | ICD-10-CM | POA: Diagnosis not present

## 2019-10-29 DIAGNOSIS — R29898 Other symptoms and signs involving the musculoskeletal system: Secondary | ICD-10-CM | POA: Diagnosis not present

## 2019-10-29 DIAGNOSIS — N135 Crossing vessel and stricture of ureter without hydronephrosis: Secondary | ICD-10-CM | POA: Diagnosis not present

## 2019-11-05 DIAGNOSIS — Z940 Kidney transplant status: Secondary | ICD-10-CM | POA: Diagnosis not present

## 2019-11-05 DIAGNOSIS — Z79899 Other long term (current) drug therapy: Secondary | ICD-10-CM | POA: Diagnosis not present

## 2019-11-05 DIAGNOSIS — Z4822 Encounter for aftercare following kidney transplant: Secondary | ICD-10-CM | POA: Diagnosis not present

## 2019-11-05 DIAGNOSIS — M8588 Other specified disorders of bone density and structure, other site: Secondary | ICD-10-CM | POA: Diagnosis not present

## 2019-11-05 DIAGNOSIS — E785 Hyperlipidemia, unspecified: Secondary | ICD-10-CM | POA: Diagnosis not present

## 2019-11-05 DIAGNOSIS — K5900 Constipation, unspecified: Secondary | ICD-10-CM | POA: Diagnosis not present

## 2019-11-05 DIAGNOSIS — M791 Myalgia, unspecified site: Secondary | ICD-10-CM | POA: Diagnosis not present

## 2019-11-05 DIAGNOSIS — M5137 Other intervertebral disc degeneration, lumbosacral region: Secondary | ICD-10-CM | POA: Diagnosis not present

## 2019-11-05 DIAGNOSIS — M47816 Spondylosis without myelopathy or radiculopathy, lumbar region: Secondary | ICD-10-CM | POA: Diagnosis not present

## 2019-11-05 DIAGNOSIS — M79605 Pain in left leg: Secondary | ICD-10-CM | POA: Diagnosis not present

## 2019-11-05 DIAGNOSIS — Z7952 Long term (current) use of systemic steroids: Secondary | ICD-10-CM | POA: Diagnosis not present

## 2019-11-05 DIAGNOSIS — Z936 Other artificial openings of urinary tract status: Secondary | ICD-10-CM | POA: Diagnosis not present

## 2019-11-05 DIAGNOSIS — D849 Immunodeficiency, unspecified: Secondary | ICD-10-CM | POA: Diagnosis not present

## 2019-11-05 DIAGNOSIS — E875 Hyperkalemia: Secondary | ICD-10-CM | POA: Diagnosis not present

## 2019-11-05 DIAGNOSIS — Z5181 Encounter for therapeutic drug level monitoring: Secondary | ICD-10-CM | POA: Diagnosis not present

## 2019-11-05 DIAGNOSIS — M1611 Unilateral primary osteoarthritis, right hip: Secondary | ICD-10-CM | POA: Diagnosis not present

## 2019-11-05 DIAGNOSIS — I10 Essential (primary) hypertension: Secondary | ICD-10-CM | POA: Diagnosis not present

## 2019-11-05 DIAGNOSIS — M47817 Spondylosis without myelopathy or radiculopathy, lumbosacral region: Secondary | ICD-10-CM | POA: Diagnosis not present

## 2019-11-05 DIAGNOSIS — K219 Gastro-esophageal reflux disease without esophagitis: Secondary | ICD-10-CM | POA: Diagnosis not present

## 2019-11-05 DIAGNOSIS — R519 Headache, unspecified: Secondary | ICD-10-CM | POA: Diagnosis not present

## 2019-11-05 DIAGNOSIS — D649 Anemia, unspecified: Secondary | ICD-10-CM | POA: Diagnosis not present

## 2019-11-05 DIAGNOSIS — Z992 Dependence on renal dialysis: Secondary | ICD-10-CM | POA: Diagnosis not present

## 2019-11-05 DIAGNOSIS — M25551 Pain in right hip: Secondary | ICD-10-CM | POA: Diagnosis not present

## 2019-11-05 DIAGNOSIS — M5136 Other intervertebral disc degeneration, lumbar region: Secondary | ICD-10-CM | POA: Diagnosis not present

## 2019-11-05 DIAGNOSIS — R0789 Other chest pain: Secondary | ICD-10-CM | POA: Diagnosis not present

## 2019-11-05 DIAGNOSIS — M461 Sacroiliitis, not elsewhere classified: Secondary | ICD-10-CM | POA: Diagnosis not present

## 2019-11-05 DIAGNOSIS — M25552 Pain in left hip: Secondary | ICD-10-CM | POA: Diagnosis not present

## 2019-11-05 DIAGNOSIS — E139 Other specified diabetes mellitus without complications: Secondary | ICD-10-CM | POA: Diagnosis not present

## 2019-11-05 DIAGNOSIS — E872 Acidosis: Secondary | ICD-10-CM | POA: Diagnosis not present

## 2019-11-11 DIAGNOSIS — Z23 Encounter for immunization: Secondary | ICD-10-CM | POA: Diagnosis not present

## 2019-11-12 DIAGNOSIS — K219 Gastro-esophageal reflux disease without esophagitis: Secondary | ICD-10-CM | POA: Diagnosis not present

## 2019-11-12 DIAGNOSIS — I10 Essential (primary) hypertension: Secondary | ICD-10-CM | POA: Diagnosis not present

## 2019-11-12 DIAGNOSIS — Z79899 Other long term (current) drug therapy: Secondary | ICD-10-CM | POA: Diagnosis not present

## 2019-11-12 DIAGNOSIS — Z7952 Long term (current) use of systemic steroids: Secondary | ICD-10-CM | POA: Diagnosis not present

## 2019-11-12 DIAGNOSIS — Z4822 Encounter for aftercare following kidney transplant: Secondary | ICD-10-CM | POA: Diagnosis not present

## 2019-11-12 DIAGNOSIS — M25551 Pain in right hip: Secondary | ICD-10-CM | POA: Diagnosis not present

## 2019-11-12 DIAGNOSIS — Z87891 Personal history of nicotine dependence: Secondary | ICD-10-CM | POA: Diagnosis not present

## 2019-11-12 DIAGNOSIS — K5900 Constipation, unspecified: Secondary | ICD-10-CM | POA: Diagnosis not present

## 2019-11-12 DIAGNOSIS — Z936 Other artificial openings of urinary tract status: Secondary | ICD-10-CM | POA: Diagnosis not present

## 2019-11-12 DIAGNOSIS — Z940 Kidney transplant status: Secondary | ICD-10-CM | POA: Diagnosis not present

## 2019-11-12 DIAGNOSIS — Z792 Long term (current) use of antibiotics: Secondary | ICD-10-CM | POA: Diagnosis not present

## 2019-11-12 DIAGNOSIS — E872 Acidosis: Secondary | ICD-10-CM | POA: Diagnosis not present

## 2019-11-12 DIAGNOSIS — D849 Immunodeficiency, unspecified: Secondary | ICD-10-CM | POA: Diagnosis not present

## 2019-11-12 DIAGNOSIS — M47816 Spondylosis without myelopathy or radiculopathy, lumbar region: Secondary | ICD-10-CM | POA: Diagnosis not present

## 2019-11-12 DIAGNOSIS — Z7984 Long term (current) use of oral hypoglycemic drugs: Secondary | ICD-10-CM | POA: Diagnosis not present

## 2019-11-12 DIAGNOSIS — R0789 Other chest pain: Secondary | ICD-10-CM | POA: Diagnosis not present

## 2019-11-12 DIAGNOSIS — N135 Crossing vessel and stricture of ureter without hydronephrosis: Secondary | ICD-10-CM | POA: Diagnosis not present

## 2019-11-12 DIAGNOSIS — R519 Headache, unspecified: Secondary | ICD-10-CM | POA: Diagnosis not present

## 2019-11-12 DIAGNOSIS — E785 Hyperlipidemia, unspecified: Secondary | ICD-10-CM | POA: Diagnosis not present

## 2019-11-12 DIAGNOSIS — M79604 Pain in right leg: Secondary | ICD-10-CM | POA: Diagnosis not present

## 2019-11-12 DIAGNOSIS — E139 Other specified diabetes mellitus without complications: Secondary | ICD-10-CM | POA: Diagnosis not present

## 2019-11-12 DIAGNOSIS — T8619 Other complication of kidney transplant: Secondary | ICD-10-CM | POA: Diagnosis not present

## 2019-11-12 DIAGNOSIS — D649 Anemia, unspecified: Secondary | ICD-10-CM | POA: Diagnosis not present

## 2019-11-12 DIAGNOSIS — M1611 Unilateral primary osteoarthritis, right hip: Secondary | ICD-10-CM | POA: Diagnosis not present

## 2019-11-12 DIAGNOSIS — R739 Hyperglycemia, unspecified: Secondary | ICD-10-CM | POA: Diagnosis not present

## 2019-11-14 DIAGNOSIS — H40013 Open angle with borderline findings, low risk, bilateral: Secondary | ICD-10-CM | POA: Diagnosis not present

## 2019-11-14 DIAGNOSIS — H2513 Age-related nuclear cataract, bilateral: Secondary | ICD-10-CM | POA: Diagnosis not present

## 2019-11-19 DIAGNOSIS — N135 Crossing vessel and stricture of ureter without hydronephrosis: Secondary | ICD-10-CM | POA: Diagnosis not present

## 2019-11-19 DIAGNOSIS — Z936 Other artificial openings of urinary tract status: Secondary | ICD-10-CM | POA: Diagnosis not present

## 2019-11-19 DIAGNOSIS — N281 Cyst of kidney, acquired: Secondary | ICD-10-CM | POA: Diagnosis not present

## 2019-11-19 DIAGNOSIS — Z792 Long term (current) use of antibiotics: Secondary | ICD-10-CM | POA: Diagnosis not present

## 2019-11-19 DIAGNOSIS — D849 Immunodeficiency, unspecified: Secondary | ICD-10-CM | POA: Diagnosis not present

## 2019-11-19 DIAGNOSIS — M79604 Pain in right leg: Secondary | ICD-10-CM | POA: Diagnosis not present

## 2019-11-19 DIAGNOSIS — Z4822 Encounter for aftercare following kidney transplant: Secondary | ICD-10-CM | POA: Diagnosis not present

## 2019-11-19 DIAGNOSIS — E139 Other specified diabetes mellitus without complications: Secondary | ICD-10-CM | POA: Diagnosis not present

## 2019-11-19 DIAGNOSIS — Z79899 Other long term (current) drug therapy: Secondary | ICD-10-CM | POA: Diagnosis not present

## 2019-11-19 DIAGNOSIS — N433 Hydrocele, unspecified: Secondary | ICD-10-CM | POA: Diagnosis not present

## 2019-11-19 DIAGNOSIS — M1990 Unspecified osteoarthritis, unspecified site: Secondary | ICD-10-CM | POA: Diagnosis not present

## 2019-11-19 DIAGNOSIS — T8619 Other complication of kidney transplant: Secondary | ICD-10-CM | POA: Diagnosis not present

## 2019-11-19 DIAGNOSIS — I10 Essential (primary) hypertension: Secondary | ICD-10-CM | POA: Diagnosis not present

## 2019-11-19 DIAGNOSIS — M25551 Pain in right hip: Secondary | ICD-10-CM | POA: Diagnosis not present

## 2019-11-19 DIAGNOSIS — E119 Type 2 diabetes mellitus without complications: Secondary | ICD-10-CM | POA: Diagnosis not present

## 2019-11-19 DIAGNOSIS — R102 Pelvic and perineal pain: Secondary | ICD-10-CM | POA: Diagnosis not present

## 2019-11-19 DIAGNOSIS — Z7952 Long term (current) use of systemic steroids: Secondary | ICD-10-CM | POA: Diagnosis not present

## 2019-11-19 DIAGNOSIS — Z7984 Long term (current) use of oral hypoglycemic drugs: Secondary | ICD-10-CM | POA: Diagnosis not present

## 2019-11-19 DIAGNOSIS — Z7982 Long term (current) use of aspirin: Secondary | ICD-10-CM | POA: Diagnosis not present

## 2019-11-19 DIAGNOSIS — Z940 Kidney transplant status: Secondary | ICD-10-CM | POA: Diagnosis not present

## 2019-11-26 DIAGNOSIS — Z436 Encounter for attention to other artificial openings of urinary tract: Secondary | ICD-10-CM | POA: Diagnosis not present

## 2019-11-26 DIAGNOSIS — Z466 Encounter for fitting and adjustment of urinary device: Secondary | ICD-10-CM | POA: Diagnosis not present

## 2019-12-02 DIAGNOSIS — Z23 Encounter for immunization: Secondary | ICD-10-CM | POA: Diagnosis not present

## 2019-12-10 DIAGNOSIS — I10 Essential (primary) hypertension: Secondary | ICD-10-CM | POA: Diagnosis not present

## 2019-12-10 DIAGNOSIS — Z4822 Encounter for aftercare following kidney transplant: Secondary | ICD-10-CM | POA: Diagnosis not present

## 2019-12-10 DIAGNOSIS — R1031 Right lower quadrant pain: Secondary | ICD-10-CM | POA: Diagnosis not present

## 2019-12-10 DIAGNOSIS — M545 Low back pain: Secondary | ICD-10-CM | POA: Diagnosis not present

## 2019-12-10 DIAGNOSIS — N135 Crossing vessel and stricture of ureter without hydronephrosis: Secondary | ICD-10-CM | POA: Diagnosis not present

## 2019-12-10 DIAGNOSIS — T8619 Other complication of kidney transplant: Secondary | ICD-10-CM | POA: Diagnosis not present

## 2019-12-10 DIAGNOSIS — E139 Other specified diabetes mellitus without complications: Secondary | ICD-10-CM | POA: Diagnosis not present

## 2019-12-10 DIAGNOSIS — Z940 Kidney transplant status: Secondary | ICD-10-CM | POA: Diagnosis not present

## 2019-12-10 DIAGNOSIS — D849 Immunodeficiency, unspecified: Secondary | ICD-10-CM | POA: Diagnosis not present

## 2019-12-17 DIAGNOSIS — M545 Low back pain: Secondary | ICD-10-CM | POA: Diagnosis not present

## 2019-12-17 DIAGNOSIS — Z940 Kidney transplant status: Secondary | ICD-10-CM | POA: Diagnosis not present

## 2019-12-17 DIAGNOSIS — M5116 Intervertebral disc disorders with radiculopathy, lumbar region: Secondary | ICD-10-CM | POA: Diagnosis not present

## 2019-12-24 DIAGNOSIS — T8619 Other complication of kidney transplant: Secondary | ICD-10-CM | POA: Diagnosis not present

## 2019-12-24 DIAGNOSIS — I10 Essential (primary) hypertension: Secondary | ICD-10-CM | POA: Diagnosis not present

## 2019-12-24 DIAGNOSIS — Z5181 Encounter for therapeutic drug level monitoring: Secondary | ICD-10-CM | POA: Diagnosis not present

## 2019-12-24 DIAGNOSIS — E785 Hyperlipidemia, unspecified: Secondary | ICD-10-CM | POA: Diagnosis not present

## 2019-12-24 DIAGNOSIS — Z940 Kidney transplant status: Secondary | ICD-10-CM | POA: Diagnosis not present

## 2019-12-24 DIAGNOSIS — D849 Immunodeficiency, unspecified: Secondary | ICD-10-CM | POA: Diagnosis not present

## 2019-12-24 DIAGNOSIS — Z936 Other artificial openings of urinary tract status: Secondary | ICD-10-CM | POA: Diagnosis not present

## 2019-12-24 DIAGNOSIS — N131 Hydronephrosis with ureteral stricture, not elsewhere classified: Secondary | ICD-10-CM | POA: Diagnosis not present

## 2019-12-24 DIAGNOSIS — Z466 Encounter for fitting and adjustment of urinary device: Secondary | ICD-10-CM | POA: Diagnosis not present

## 2019-12-24 DIAGNOSIS — E139 Other specified diabetes mellitus without complications: Secondary | ICD-10-CM | POA: Diagnosis not present

## 2019-12-24 DIAGNOSIS — K219 Gastro-esophageal reflux disease without esophagitis: Secondary | ICD-10-CM | POA: Diagnosis not present

## 2019-12-24 DIAGNOSIS — M545 Low back pain: Secondary | ICD-10-CM | POA: Diagnosis not present

## 2019-12-24 DIAGNOSIS — Z79899 Other long term (current) drug therapy: Secondary | ICD-10-CM | POA: Diagnosis not present

## 2019-12-24 DIAGNOSIS — Z4822 Encounter for aftercare following kidney transplant: Secondary | ICD-10-CM | POA: Diagnosis not present

## 2019-12-27 DIAGNOSIS — N135 Crossing vessel and stricture of ureter without hydronephrosis: Secondary | ICD-10-CM | POA: Diagnosis not present

## 2019-12-27 DIAGNOSIS — T8619 Other complication of kidney transplant: Secondary | ICD-10-CM | POA: Diagnosis not present

## 2019-12-27 DIAGNOSIS — Z940 Kidney transplant status: Secondary | ICD-10-CM | POA: Diagnosis not present

## 2019-12-27 DIAGNOSIS — N35819 Other urethral stricture, male, unspecified site: Secondary | ICD-10-CM | POA: Diagnosis not present

## 2019-12-27 DIAGNOSIS — Z466 Encounter for fitting and adjustment of urinary device: Secondary | ICD-10-CM | POA: Diagnosis not present

## 2020-01-01 DIAGNOSIS — M545 Low back pain: Secondary | ICD-10-CM | POA: Diagnosis not present

## 2020-01-07 DIAGNOSIS — Z940 Kidney transplant status: Secondary | ICD-10-CM | POA: Diagnosis not present

## 2020-01-07 DIAGNOSIS — Z4822 Encounter for aftercare following kidney transplant: Secondary | ICD-10-CM | POA: Diagnosis not present

## 2020-01-21 DIAGNOSIS — Z940 Kidney transplant status: Secondary | ICD-10-CM | POA: Diagnosis not present

## 2020-01-21 DIAGNOSIS — Z4822 Encounter for aftercare following kidney transplant: Secondary | ICD-10-CM | POA: Diagnosis not present

## 2020-01-21 DIAGNOSIS — T8619 Other complication of kidney transplant: Secondary | ICD-10-CM | POA: Diagnosis not present

## 2020-01-21 DIAGNOSIS — Z792 Long term (current) use of antibiotics: Secondary | ICD-10-CM | POA: Diagnosis not present

## 2020-01-21 DIAGNOSIS — Z79899 Other long term (current) drug therapy: Secondary | ICD-10-CM | POA: Diagnosis not present

## 2020-01-21 DIAGNOSIS — Z960 Presence of urogenital implants: Secondary | ICD-10-CM | POA: Diagnosis not present

## 2020-01-21 DIAGNOSIS — R102 Pelvic and perineal pain: Secondary | ICD-10-CM | POA: Diagnosis not present

## 2020-01-21 DIAGNOSIS — K219 Gastro-esophageal reflux disease without esophagitis: Secondary | ICD-10-CM | POA: Diagnosis not present

## 2020-01-21 DIAGNOSIS — M1611 Unilateral primary osteoarthritis, right hip: Secondary | ICD-10-CM | POA: Diagnosis not present

## 2020-01-21 DIAGNOSIS — I119 Hypertensive heart disease without heart failure: Secondary | ICD-10-CM | POA: Diagnosis not present

## 2020-01-21 DIAGNOSIS — Z87891 Personal history of nicotine dependence: Secondary | ICD-10-CM | POA: Diagnosis not present

## 2020-01-21 DIAGNOSIS — Z7982 Long term (current) use of aspirin: Secondary | ICD-10-CM | POA: Diagnosis not present

## 2020-01-21 DIAGNOSIS — Z7984 Long term (current) use of oral hypoglycemic drugs: Secondary | ICD-10-CM | POA: Diagnosis not present

## 2020-01-21 DIAGNOSIS — N135 Crossing vessel and stricture of ureter without hydronephrosis: Secondary | ICD-10-CM | POA: Diagnosis not present

## 2020-01-21 DIAGNOSIS — M47816 Spondylosis without myelopathy or radiculopathy, lumbar region: Secondary | ICD-10-CM | POA: Diagnosis not present

## 2020-01-21 DIAGNOSIS — Z7952 Long term (current) use of systemic steroids: Secondary | ICD-10-CM | POA: Diagnosis not present

## 2020-01-21 DIAGNOSIS — M79604 Pain in right leg: Secondary | ICD-10-CM | POA: Diagnosis not present

## 2020-01-21 DIAGNOSIS — E785 Hyperlipidemia, unspecified: Secondary | ICD-10-CM | POA: Diagnosis not present

## 2020-01-21 DIAGNOSIS — E1365 Other specified diabetes mellitus with hyperglycemia: Secondary | ICD-10-CM | POA: Diagnosis not present

## 2020-01-21 DIAGNOSIS — D649 Anemia, unspecified: Secondary | ICD-10-CM | POA: Diagnosis not present

## 2020-01-21 DIAGNOSIS — M7918 Myalgia, other site: Secondary | ICD-10-CM | POA: Diagnosis not present

## 2020-02-04 DIAGNOSIS — M25551 Pain in right hip: Secondary | ICD-10-CM | POA: Diagnosis not present

## 2020-02-04 DIAGNOSIS — K219 Gastro-esophageal reflux disease without esophagitis: Secondary | ICD-10-CM | POA: Diagnosis not present

## 2020-02-04 DIAGNOSIS — M479 Spondylosis, unspecified: Secondary | ICD-10-CM | POA: Diagnosis not present

## 2020-02-04 DIAGNOSIS — Z992 Dependence on renal dialysis: Secondary | ICD-10-CM | POA: Diagnosis not present

## 2020-02-04 DIAGNOSIS — Z936 Other artificial openings of urinary tract status: Secondary | ICD-10-CM | POA: Diagnosis not present

## 2020-02-04 DIAGNOSIS — E785 Hyperlipidemia, unspecified: Secondary | ICD-10-CM | POA: Diagnosis not present

## 2020-02-04 DIAGNOSIS — Z940 Kidney transplant status: Secondary | ICD-10-CM | POA: Diagnosis not present

## 2020-02-04 DIAGNOSIS — I120 Hypertensive chronic kidney disease with stage 5 chronic kidney disease or end stage renal disease: Secondary | ICD-10-CM | POA: Diagnosis not present

## 2020-02-04 DIAGNOSIS — R0789 Other chest pain: Secondary | ICD-10-CM | POA: Diagnosis not present

## 2020-02-04 DIAGNOSIS — E139 Other specified diabetes mellitus without complications: Secondary | ICD-10-CM | POA: Diagnosis not present

## 2020-02-04 DIAGNOSIS — T8619 Other complication of kidney transplant: Secondary | ICD-10-CM | POA: Diagnosis not present

## 2020-02-04 DIAGNOSIS — I10 Essential (primary) hypertension: Secondary | ICD-10-CM | POA: Diagnosis not present

## 2020-02-04 DIAGNOSIS — N186 End stage renal disease: Secondary | ICD-10-CM | POA: Diagnosis not present

## 2020-02-04 DIAGNOSIS — E872 Acidosis: Secondary | ICD-10-CM | POA: Diagnosis not present

## 2020-02-04 DIAGNOSIS — R739 Hyperglycemia, unspecified: Secondary | ICD-10-CM | POA: Diagnosis not present

## 2020-02-04 DIAGNOSIS — Z4822 Encounter for aftercare following kidney transplant: Secondary | ICD-10-CM | POA: Diagnosis not present

## 2020-02-04 DIAGNOSIS — N135 Crossing vessel and stricture of ureter without hydronephrosis: Secondary | ICD-10-CM | POA: Diagnosis not present

## 2020-02-04 DIAGNOSIS — D649 Anemia, unspecified: Secondary | ICD-10-CM | POA: Diagnosis not present

## 2020-02-04 DIAGNOSIS — D849 Immunodeficiency, unspecified: Secondary | ICD-10-CM | POA: Diagnosis not present

## 2020-02-07 DIAGNOSIS — Z936 Other artificial openings of urinary tract status: Secondary | ICD-10-CM | POA: Diagnosis not present

## 2020-02-07 DIAGNOSIS — Z466 Encounter for fitting and adjustment of urinary device: Secondary | ICD-10-CM | POA: Diagnosis not present

## 2020-02-07 DIAGNOSIS — Z940 Kidney transplant status: Secondary | ICD-10-CM | POA: Diagnosis not present

## 2020-02-18 DIAGNOSIS — N1330 Unspecified hydronephrosis: Secondary | ICD-10-CM | POA: Diagnosis not present

## 2020-02-18 DIAGNOSIS — M25551 Pain in right hip: Secondary | ICD-10-CM | POA: Diagnosis not present

## 2020-02-18 DIAGNOSIS — Z940 Kidney transplant status: Secondary | ICD-10-CM | POA: Diagnosis not present

## 2020-02-18 DIAGNOSIS — R102 Pelvic and perineal pain: Secondary | ICD-10-CM | POA: Diagnosis not present

## 2020-02-18 DIAGNOSIS — Z792 Long term (current) use of antibiotics: Secondary | ICD-10-CM | POA: Diagnosis not present

## 2020-02-18 DIAGNOSIS — Z4822 Encounter for aftercare following kidney transplant: Secondary | ICD-10-CM | POA: Diagnosis not present

## 2020-02-18 DIAGNOSIS — I10 Essential (primary) hypertension: Secondary | ICD-10-CM | POA: Diagnosis not present

## 2020-02-18 DIAGNOSIS — K219 Gastro-esophageal reflux disease without esophagitis: Secondary | ICD-10-CM | POA: Diagnosis not present

## 2020-02-18 DIAGNOSIS — D649 Anemia, unspecified: Secondary | ICD-10-CM | POA: Diagnosis not present

## 2020-02-18 DIAGNOSIS — M79604 Pain in right leg: Secondary | ICD-10-CM | POA: Diagnosis not present

## 2020-02-18 DIAGNOSIS — N135 Crossing vessel and stricture of ureter without hydronephrosis: Secondary | ICD-10-CM | POA: Diagnosis not present

## 2020-02-18 DIAGNOSIS — Z79899 Other long term (current) drug therapy: Secondary | ICD-10-CM | POA: Diagnosis not present

## 2020-02-18 DIAGNOSIS — D849 Immunodeficiency, unspecified: Secondary | ICD-10-CM | POA: Diagnosis not present

## 2020-02-18 DIAGNOSIS — E785 Hyperlipidemia, unspecified: Secondary | ICD-10-CM | POA: Diagnosis not present

## 2020-02-18 DIAGNOSIS — T8619 Other complication of kidney transplant: Secondary | ICD-10-CM | POA: Diagnosis not present

## 2020-02-18 DIAGNOSIS — E139 Other specified diabetes mellitus without complications: Secondary | ICD-10-CM | POA: Diagnosis not present

## 2020-02-18 DIAGNOSIS — Z7952 Long term (current) use of systemic steroids: Secondary | ICD-10-CM | POA: Diagnosis not present

## 2020-02-18 DIAGNOSIS — E872 Acidosis: Secondary | ICD-10-CM | POA: Diagnosis not present

## 2020-02-26 DIAGNOSIS — M5441 Lumbago with sciatica, right side: Secondary | ICD-10-CM | POA: Diagnosis not present

## 2020-03-03 DIAGNOSIS — E1122 Type 2 diabetes mellitus with diabetic chronic kidney disease: Secondary | ICD-10-CM | POA: Diagnosis not present

## 2020-03-03 DIAGNOSIS — K219 Gastro-esophageal reflux disease without esophagitis: Secondary | ICD-10-CM | POA: Diagnosis not present

## 2020-03-03 DIAGNOSIS — N135 Crossing vessel and stricture of ureter without hydronephrosis: Secondary | ICD-10-CM | POA: Diagnosis not present

## 2020-03-03 DIAGNOSIS — I10 Essential (primary) hypertension: Secondary | ICD-10-CM | POA: Diagnosis not present

## 2020-03-03 DIAGNOSIS — H6121 Impacted cerumen, right ear: Secondary | ICD-10-CM | POA: Diagnosis not present

## 2020-03-03 DIAGNOSIS — Z992 Dependence on renal dialysis: Secondary | ICD-10-CM | POA: Diagnosis not present

## 2020-03-03 DIAGNOSIS — E785 Hyperlipidemia, unspecified: Secondary | ICD-10-CM | POA: Diagnosis not present

## 2020-03-03 DIAGNOSIS — R0789 Other chest pain: Secondary | ICD-10-CM | POA: Diagnosis not present

## 2020-03-03 DIAGNOSIS — Z888 Allergy status to other drugs, medicaments and biological substances status: Secondary | ICD-10-CM | POA: Diagnosis not present

## 2020-03-03 DIAGNOSIS — R05 Cough: Secondary | ICD-10-CM | POA: Diagnosis not present

## 2020-03-03 DIAGNOSIS — M479 Spondylosis, unspecified: Secondary | ICD-10-CM | POA: Diagnosis not present

## 2020-03-03 DIAGNOSIS — T8619 Other complication of kidney transplant: Secondary | ICD-10-CM | POA: Diagnosis not present

## 2020-03-03 DIAGNOSIS — I120 Hypertensive chronic kidney disease with stage 5 chronic kidney disease or end stage renal disease: Secondary | ICD-10-CM | POA: Diagnosis not present

## 2020-03-03 DIAGNOSIS — Z87891 Personal history of nicotine dependence: Secondary | ICD-10-CM | POA: Diagnosis not present

## 2020-03-03 DIAGNOSIS — E1165 Type 2 diabetes mellitus with hyperglycemia: Secondary | ICD-10-CM | POA: Diagnosis not present

## 2020-03-03 DIAGNOSIS — D849 Immunodeficiency, unspecified: Secondary | ICD-10-CM | POA: Diagnosis not present

## 2020-03-03 DIAGNOSIS — R001 Bradycardia, unspecified: Secondary | ICD-10-CM | POA: Diagnosis not present

## 2020-03-03 DIAGNOSIS — N1330 Unspecified hydronephrosis: Secondary | ICD-10-CM | POA: Diagnosis not present

## 2020-03-03 DIAGNOSIS — E872 Acidosis: Secondary | ICD-10-CM | POA: Diagnosis not present

## 2020-03-03 DIAGNOSIS — Z960 Presence of urogenital implants: Secondary | ICD-10-CM | POA: Diagnosis not present

## 2020-03-03 DIAGNOSIS — E139 Other specified diabetes mellitus without complications: Secondary | ICD-10-CM | POA: Diagnosis not present

## 2020-03-03 DIAGNOSIS — Z4822 Encounter for aftercare following kidney transplant: Secondary | ICD-10-CM | POA: Diagnosis not present

## 2020-03-03 DIAGNOSIS — N186 End stage renal disease: Secondary | ICD-10-CM | POA: Diagnosis not present

## 2020-03-03 DIAGNOSIS — Z940 Kidney transplant status: Secondary | ICD-10-CM | POA: Diagnosis not present

## 2020-03-05 DIAGNOSIS — N135 Crossing vessel and stricture of ureter without hydronephrosis: Secondary | ICD-10-CM | POA: Diagnosis not present

## 2020-03-05 DIAGNOSIS — T8619 Other complication of kidney transplant: Secondary | ICD-10-CM | POA: Diagnosis not present

## 2020-03-13 DIAGNOSIS — Z23 Encounter for immunization: Secondary | ICD-10-CM | POA: Diagnosis not present

## 2020-03-31 DIAGNOSIS — Z888 Allergy status to other drugs, medicaments and biological substances status: Secondary | ICD-10-CM | POA: Diagnosis not present

## 2020-03-31 DIAGNOSIS — E785 Hyperlipidemia, unspecified: Secondary | ICD-10-CM | POA: Diagnosis not present

## 2020-03-31 DIAGNOSIS — M79604 Pain in right leg: Secondary | ICD-10-CM | POA: Diagnosis not present

## 2020-03-31 DIAGNOSIS — N186 End stage renal disease: Secondary | ICD-10-CM | POA: Diagnosis not present

## 2020-03-31 DIAGNOSIS — D849 Immunodeficiency, unspecified: Secondary | ICD-10-CM | POA: Diagnosis not present

## 2020-03-31 DIAGNOSIS — Z7984 Long term (current) use of oral hypoglycemic drugs: Secondary | ICD-10-CM | POA: Diagnosis not present

## 2020-03-31 DIAGNOSIS — I10 Essential (primary) hypertension: Secondary | ICD-10-CM | POA: Diagnosis not present

## 2020-03-31 DIAGNOSIS — E872 Acidosis: Secondary | ICD-10-CM | POA: Diagnosis not present

## 2020-03-31 DIAGNOSIS — Z79899 Other long term (current) drug therapy: Secondary | ICD-10-CM | POA: Diagnosis not present

## 2020-03-31 DIAGNOSIS — D631 Anemia in chronic kidney disease: Secondary | ICD-10-CM | POA: Diagnosis not present

## 2020-03-31 DIAGNOSIS — R102 Pelvic and perineal pain: Secondary | ICD-10-CM | POA: Diagnosis not present

## 2020-03-31 DIAGNOSIS — N1330 Unspecified hydronephrosis: Secondary | ICD-10-CM | POA: Diagnosis not present

## 2020-03-31 DIAGNOSIS — I120 Hypertensive chronic kidney disease with stage 5 chronic kidney disease or end stage renal disease: Secondary | ICD-10-CM | POA: Diagnosis not present

## 2020-03-31 DIAGNOSIS — Z940 Kidney transplant status: Secondary | ICD-10-CM | POA: Diagnosis not present

## 2020-03-31 DIAGNOSIS — E891 Postprocedural hypoinsulinemia: Secondary | ICD-10-CM | POA: Diagnosis not present

## 2020-03-31 DIAGNOSIS — Z7952 Long term (current) use of systemic steroids: Secondary | ICD-10-CM | POA: Diagnosis not present

## 2020-03-31 DIAGNOSIS — R0789 Other chest pain: Secondary | ICD-10-CM | POA: Diagnosis not present

## 2020-03-31 DIAGNOSIS — R05 Cough: Secondary | ICD-10-CM | POA: Diagnosis not present

## 2020-03-31 DIAGNOSIS — K219 Gastro-esophageal reflux disease without esophagitis: Secondary | ICD-10-CM | POA: Diagnosis not present

## 2020-03-31 DIAGNOSIS — Z4822 Encounter for aftercare following kidney transplant: Secondary | ICD-10-CM | POA: Diagnosis not present

## 2020-03-31 DIAGNOSIS — H6121 Impacted cerumen, right ear: Secondary | ICD-10-CM | POA: Diagnosis not present

## 2020-03-31 DIAGNOSIS — E139 Other specified diabetes mellitus without complications: Secondary | ICD-10-CM | POA: Diagnosis not present

## 2020-04-28 DIAGNOSIS — E872 Acidosis: Secondary | ICD-10-CM | POA: Diagnosis not present

## 2020-04-28 DIAGNOSIS — Z79899 Other long term (current) drug therapy: Secondary | ICD-10-CM | POA: Diagnosis not present

## 2020-04-28 DIAGNOSIS — E785 Hyperlipidemia, unspecified: Secondary | ICD-10-CM | POA: Diagnosis not present

## 2020-04-28 DIAGNOSIS — R001 Bradycardia, unspecified: Secondary | ICD-10-CM | POA: Diagnosis not present

## 2020-04-28 DIAGNOSIS — Z7952 Long term (current) use of systemic steroids: Secondary | ICD-10-CM | POA: Diagnosis not present

## 2020-04-28 DIAGNOSIS — R102 Pelvic and perineal pain: Secondary | ICD-10-CM | POA: Diagnosis not present

## 2020-04-28 DIAGNOSIS — K219 Gastro-esophageal reflux disease without esophagitis: Secondary | ICD-10-CM | POA: Diagnosis not present

## 2020-04-28 DIAGNOSIS — R739 Hyperglycemia, unspecified: Secondary | ICD-10-CM | POA: Diagnosis not present

## 2020-04-28 DIAGNOSIS — M79606 Pain in leg, unspecified: Secondary | ICD-10-CM | POA: Diagnosis not present

## 2020-04-28 DIAGNOSIS — N1330 Unspecified hydronephrosis: Secondary | ICD-10-CM | POA: Diagnosis not present

## 2020-04-28 DIAGNOSIS — Z794 Long term (current) use of insulin: Secondary | ICD-10-CM | POA: Diagnosis not present

## 2020-04-28 DIAGNOSIS — E139 Other specified diabetes mellitus without complications: Secondary | ICD-10-CM | POA: Diagnosis not present

## 2020-04-28 DIAGNOSIS — I10 Essential (primary) hypertension: Secondary | ICD-10-CM | POA: Diagnosis not present

## 2020-04-28 DIAGNOSIS — Z940 Kidney transplant status: Secondary | ICD-10-CM | POA: Diagnosis not present

## 2020-04-28 DIAGNOSIS — Z7982 Long term (current) use of aspirin: Secondary | ICD-10-CM | POA: Diagnosis not present

## 2020-04-28 DIAGNOSIS — Z4822 Encounter for aftercare following kidney transplant: Secondary | ICD-10-CM | POA: Diagnosis not present

## 2020-04-28 DIAGNOSIS — Z792 Long term (current) use of antibiotics: Secondary | ICD-10-CM | POA: Diagnosis not present

## 2020-05-06 DIAGNOSIS — Z8673 Personal history of transient ischemic attack (TIA), and cerebral infarction without residual deficits: Secondary | ICD-10-CM | POA: Diagnosis not present

## 2020-05-06 DIAGNOSIS — Z466 Encounter for fitting and adjustment of urinary device: Secondary | ICD-10-CM | POA: Diagnosis not present

## 2020-05-06 DIAGNOSIS — N135 Crossing vessel and stricture of ureter without hydronephrosis: Secondary | ICD-10-CM | POA: Diagnosis not present

## 2020-05-06 DIAGNOSIS — K219 Gastro-esophageal reflux disease without esophagitis: Secondary | ICD-10-CM | POA: Diagnosis not present

## 2020-05-06 DIAGNOSIS — T8619 Other complication of kidney transplant: Secondary | ICD-10-CM | POA: Diagnosis not present

## 2020-05-06 DIAGNOSIS — Z940 Kidney transplant status: Secondary | ICD-10-CM | POA: Diagnosis not present

## 2020-05-06 DIAGNOSIS — N186 End stage renal disease: Secondary | ICD-10-CM | POA: Diagnosis not present

## 2020-05-06 DIAGNOSIS — M5416 Radiculopathy, lumbar region: Secondary | ICD-10-CM | POA: Diagnosis not present

## 2020-05-06 DIAGNOSIS — Z7982 Long term (current) use of aspirin: Secondary | ICD-10-CM | POA: Diagnosis not present

## 2020-05-06 DIAGNOSIS — Z79899 Other long term (current) drug therapy: Secondary | ICD-10-CM | POA: Diagnosis not present

## 2020-05-06 DIAGNOSIS — E1122 Type 2 diabetes mellitus with diabetic chronic kidney disease: Secondary | ICD-10-CM | POA: Diagnosis not present

## 2020-05-06 DIAGNOSIS — Z7984 Long term (current) use of oral hypoglycemic drugs: Secondary | ICD-10-CM | POA: Diagnosis not present

## 2020-05-06 DIAGNOSIS — C679 Malignant neoplasm of bladder, unspecified: Secondary | ICD-10-CM | POA: Diagnosis not present

## 2020-05-06 DIAGNOSIS — I120 Hypertensive chronic kidney disease with stage 5 chronic kidney disease or end stage renal disease: Secondary | ICD-10-CM | POA: Diagnosis not present

## 2020-05-06 DIAGNOSIS — D539 Nutritional anemia, unspecified: Secondary | ICD-10-CM | POA: Diagnosis not present

## 2020-05-19 IMAGING — CT CT HEAD W/O CM
3 of 4 series · 16 of 47 positions shown, 19 images · non-contrast
Comparison: None

CLINICAL DATA: TIA, tingling from arms to fingers on 1 episode with
with another episode of tingling in the face, history chronic kidney
disease on dialysis, hypertension, former smoker

EXAM:
CT HEAD WITHOUT CONTRAST
TECHNIQUE: Contiguous axial images were obtained from the base of the skull
through the vertex without intravenous contrast. Sagittal and
coronal MPR images reconstructed from axial data set.

[Series 4: head 2.0 h70h · axial · 0.45mm/px · z∈[-95,+55]mm · 10 of 85 slices shown, 13 images]
[im 5/85  brain]
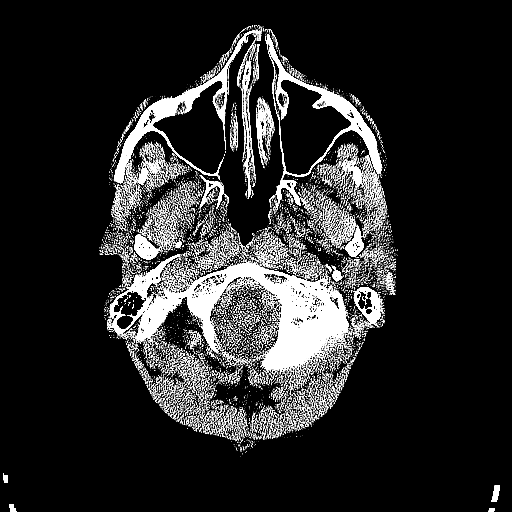
[im 5/85  bone]
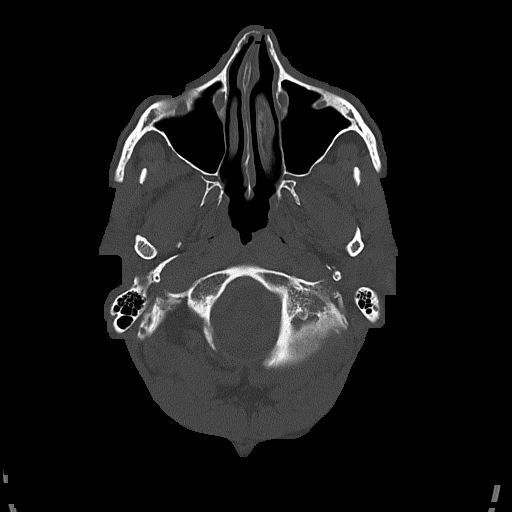
[im 13/85  brain]
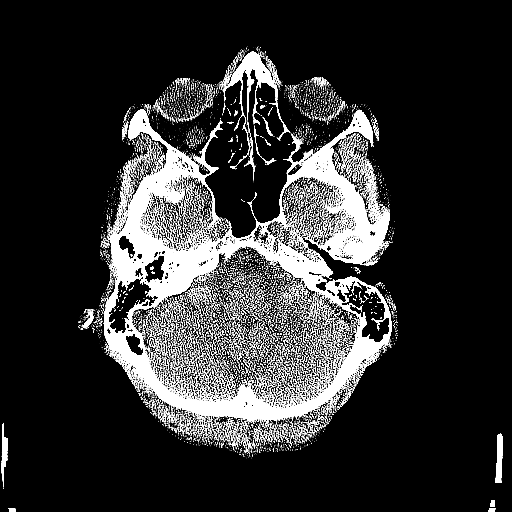
[im 22/85  brain]
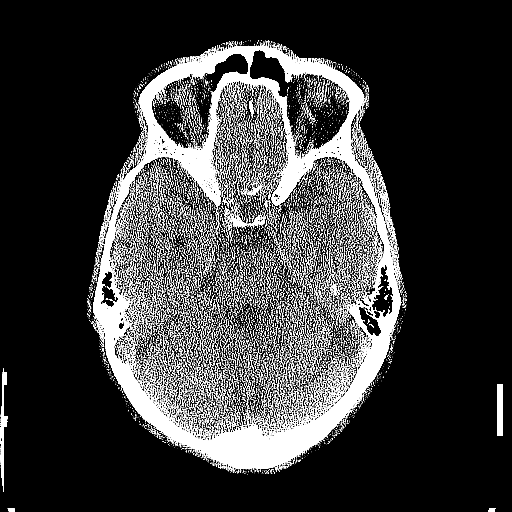
[im 30/85  brain]
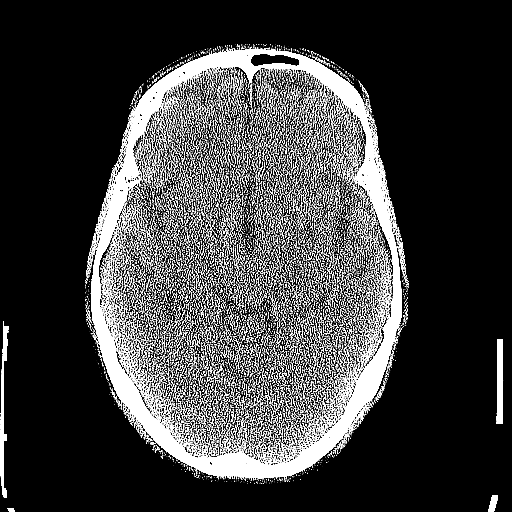
[im 38/85  brain]
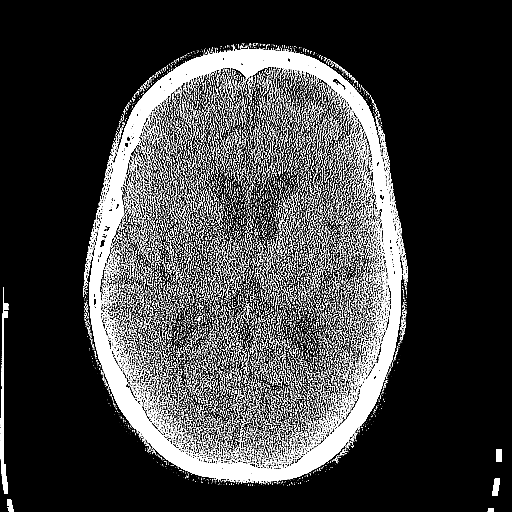
[im 38/85  bone]
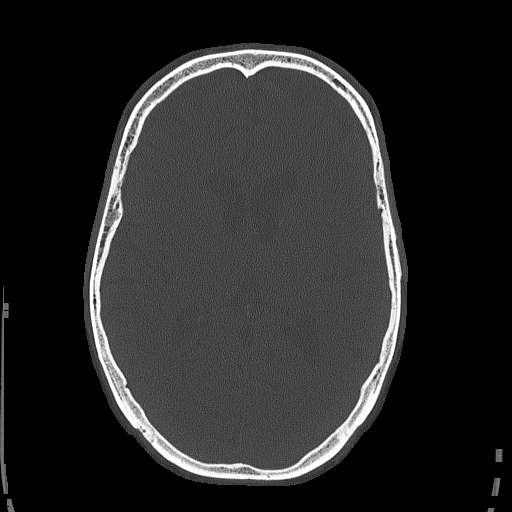
[im 47/85  brain]
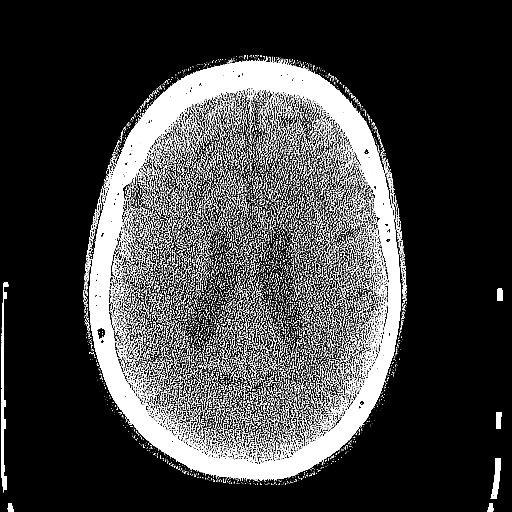
[im 55/85  brain]
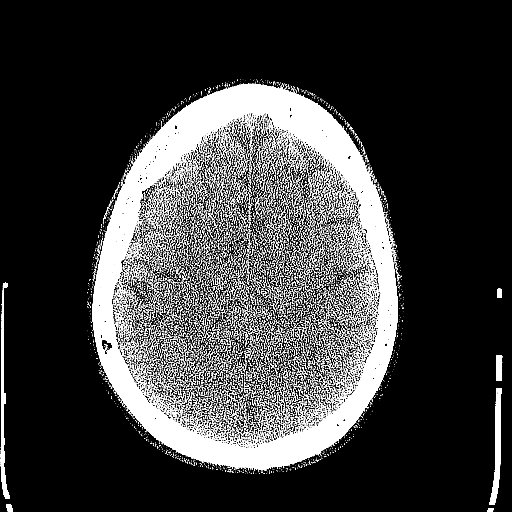
[im 64/85  brain]
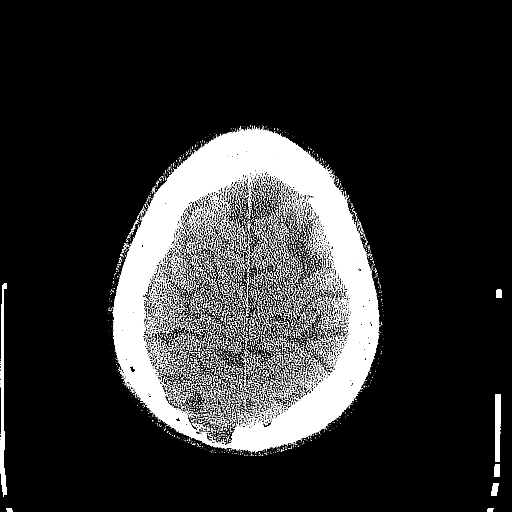
[im 72/85  brain]
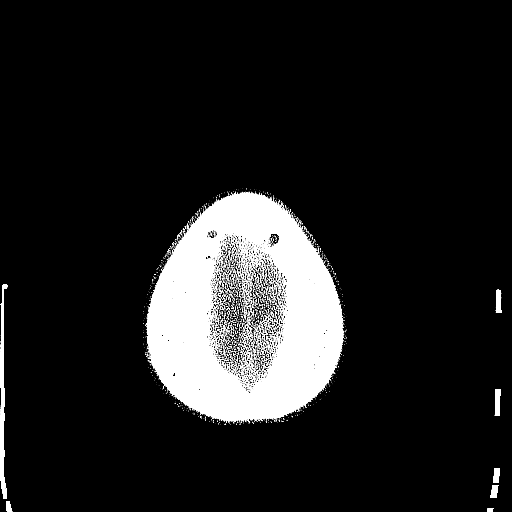
[im 72/85  bone]
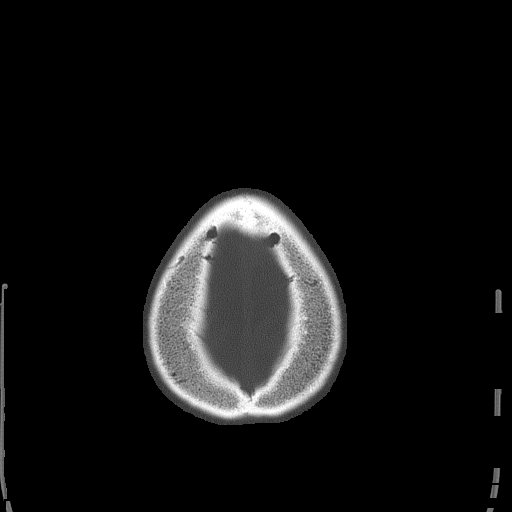
[im 80/85  brain]
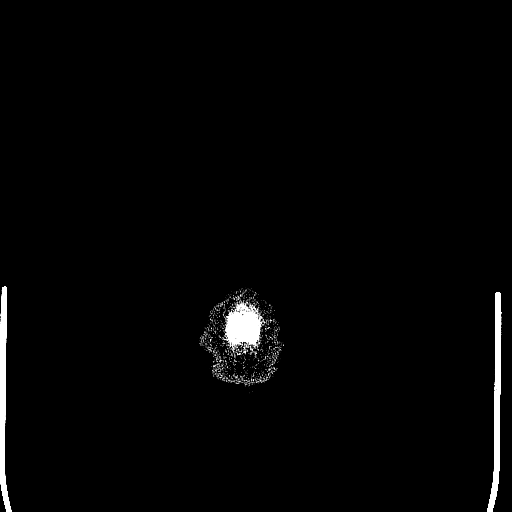

[Series 5: head 3.0 mpr cor · coronal · 0.32mm/px · 3 of 70 slices shown]
[im 24/70  brain]
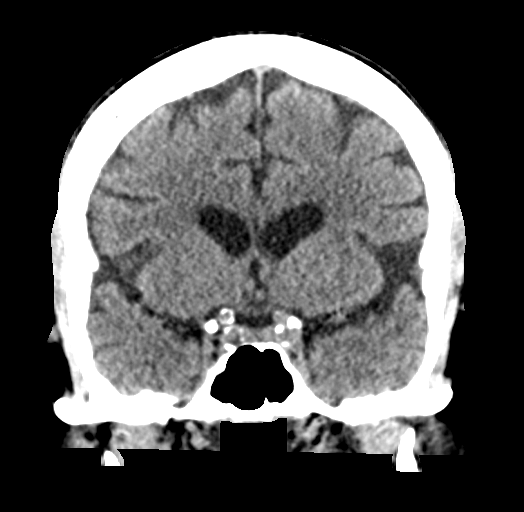
[im 31/70  brain]
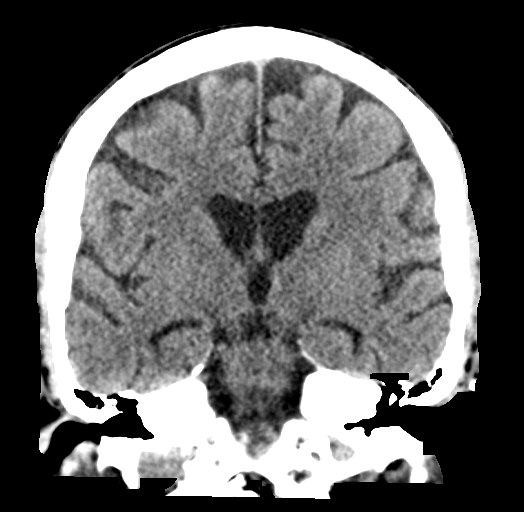
[im 39/70  brain]
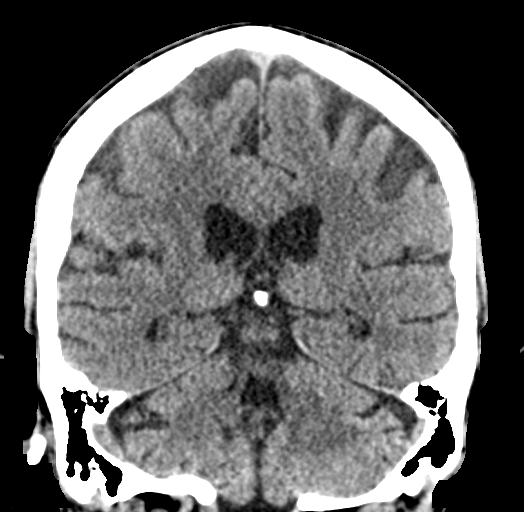

[Series 6: head 3.0 mpr sag · sagittal · 0.32mm/px · 3 of 57 slices shown]
[im 19/57  brain]
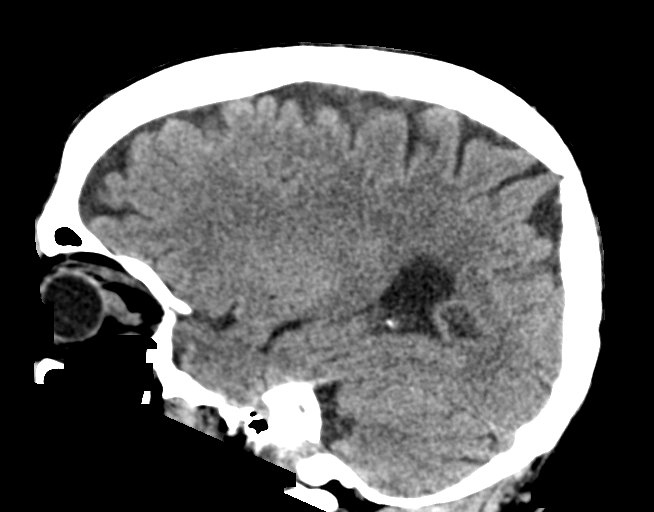
[im 29/57  brain]
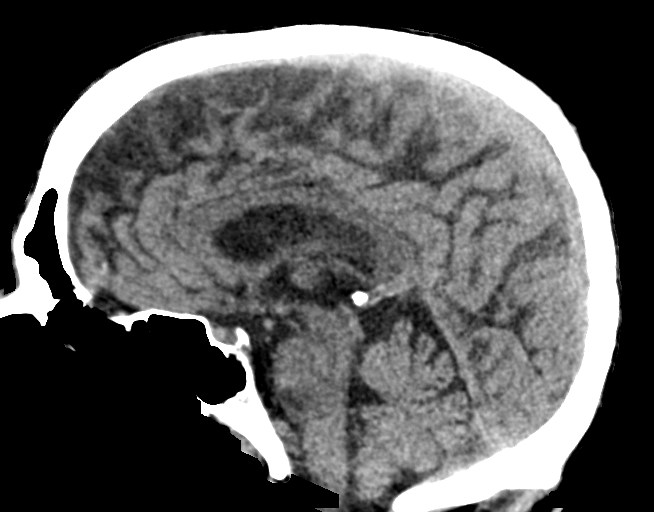
[im 38/57  brain]
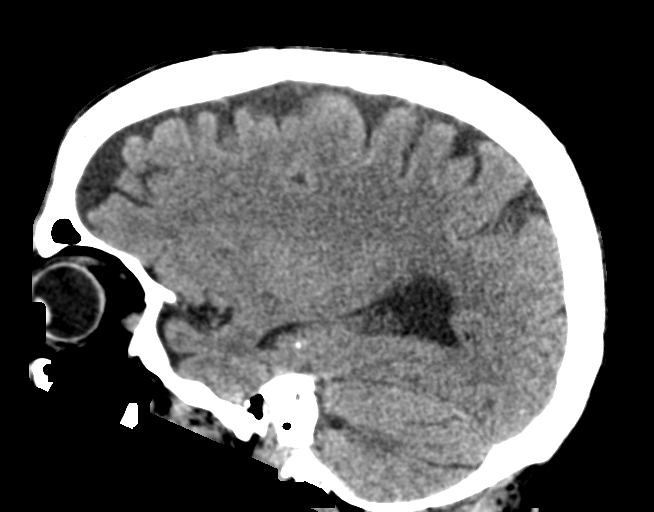

[16 of 47 positions shown; findings below may reference images not displayed]

FINDINGS: Brain: Mild generalized atrophy. Normal ventricular morphology. No
midline shift or mass effect. Otherwise normal appearance of brain
parenchyma. No intracranial hemorrhage, mass lesion or evidence of
acute infarction. No extra-axial fluid collections.

Vascular: Mild atherosclerotic calcification of internal carotid
arteries bilaterally at skull base

Skull: Intact

Sinuses/Orbits: Clear

Other: N/A
IMPRESSION: No acute intracranial abnormalities.

## 2020-05-26 DIAGNOSIS — Z862 Personal history of diseases of the blood and blood-forming organs and certain disorders involving the immune mechanism: Secondary | ICD-10-CM | POA: Diagnosis not present

## 2020-05-26 DIAGNOSIS — E872 Acidosis: Secondary | ICD-10-CM | POA: Diagnosis not present

## 2020-05-26 DIAGNOSIS — E875 Hyperkalemia: Secondary | ICD-10-CM | POA: Diagnosis not present

## 2020-05-26 DIAGNOSIS — K219 Gastro-esophageal reflux disease without esophagitis: Secondary | ICD-10-CM | POA: Diagnosis not present

## 2020-05-26 DIAGNOSIS — E891 Postprocedural hypoinsulinemia: Secondary | ICD-10-CM | POA: Diagnosis not present

## 2020-05-26 DIAGNOSIS — M79604 Pain in right leg: Secondary | ICD-10-CM | POA: Diagnosis not present

## 2020-05-26 DIAGNOSIS — Z940 Kidney transplant status: Secondary | ICD-10-CM | POA: Diagnosis not present

## 2020-05-26 DIAGNOSIS — I10 Essential (primary) hypertension: Secondary | ICD-10-CM | POA: Diagnosis not present

## 2020-05-26 DIAGNOSIS — N135 Crossing vessel and stricture of ureter without hydronephrosis: Secondary | ICD-10-CM | POA: Diagnosis not present

## 2020-05-26 DIAGNOSIS — R102 Pelvic and perineal pain: Secondary | ICD-10-CM | POA: Diagnosis not present

## 2020-05-26 DIAGNOSIS — T8619 Other complication of kidney transplant: Secondary | ICD-10-CM | POA: Diagnosis not present

## 2020-05-26 DIAGNOSIS — Z9889 Other specified postprocedural states: Secondary | ICD-10-CM | POA: Diagnosis not present

## 2020-05-26 DIAGNOSIS — Z7952 Long term (current) use of systemic steroids: Secondary | ICD-10-CM | POA: Diagnosis not present

## 2020-05-26 DIAGNOSIS — D849 Immunodeficiency, unspecified: Secondary | ICD-10-CM | POA: Diagnosis not present

## 2020-05-26 DIAGNOSIS — Z792 Long term (current) use of antibiotics: Secondary | ICD-10-CM | POA: Diagnosis not present

## 2020-05-26 DIAGNOSIS — Z960 Presence of urogenital implants: Secondary | ICD-10-CM | POA: Diagnosis not present

## 2020-05-26 DIAGNOSIS — Z4822 Encounter for aftercare following kidney transplant: Secondary | ICD-10-CM | POA: Diagnosis not present

## 2020-05-26 DIAGNOSIS — N1339 Other hydronephrosis: Secondary | ICD-10-CM | POA: Diagnosis not present

## 2020-05-26 DIAGNOSIS — Z87898 Personal history of other specified conditions: Secondary | ICD-10-CM | POA: Diagnosis not present

## 2020-05-26 DIAGNOSIS — Z7982 Long term (current) use of aspirin: Secondary | ICD-10-CM | POA: Diagnosis not present

## 2020-05-26 DIAGNOSIS — E785 Hyperlipidemia, unspecified: Secondary | ICD-10-CM | POA: Diagnosis not present

## 2020-05-26 DIAGNOSIS — R0789 Other chest pain: Secondary | ICD-10-CM | POA: Diagnosis not present

## 2020-05-26 DIAGNOSIS — Z79899 Other long term (current) drug therapy: Secondary | ICD-10-CM | POA: Diagnosis not present

## 2020-06-23 DIAGNOSIS — Z7982 Long term (current) use of aspirin: Secondary | ICD-10-CM | POA: Diagnosis not present

## 2020-06-23 DIAGNOSIS — I10 Essential (primary) hypertension: Secondary | ICD-10-CM | POA: Diagnosis not present

## 2020-06-23 DIAGNOSIS — Z4822 Encounter for aftercare following kidney transplant: Secondary | ICD-10-CM | POA: Diagnosis not present

## 2020-06-23 DIAGNOSIS — Z7952 Long term (current) use of systemic steroids: Secondary | ICD-10-CM | POA: Diagnosis not present

## 2020-06-23 DIAGNOSIS — K219 Gastro-esophageal reflux disease without esophagitis: Secondary | ICD-10-CM | POA: Diagnosis not present

## 2020-06-23 DIAGNOSIS — E785 Hyperlipidemia, unspecified: Secondary | ICD-10-CM | POA: Diagnosis not present

## 2020-06-23 DIAGNOSIS — Z792 Long term (current) use of antibiotics: Secondary | ICD-10-CM | POA: Diagnosis not present

## 2020-06-23 DIAGNOSIS — D649 Anemia, unspecified: Secondary | ICD-10-CM | POA: Diagnosis not present

## 2020-06-23 DIAGNOSIS — E119 Type 2 diabetes mellitus without complications: Secondary | ICD-10-CM | POA: Diagnosis not present

## 2020-06-23 DIAGNOSIS — Z79899 Other long term (current) drug therapy: Secondary | ICD-10-CM | POA: Diagnosis not present

## 2020-06-23 DIAGNOSIS — Z960 Presence of urogenital implants: Secondary | ICD-10-CM | POA: Diagnosis not present

## 2021-09-15 DEATH — deceased
